# Patient Record
Sex: Female | Born: 1985 | Hispanic: Yes | Marital: Single | Smoking: Former smoker
Health system: Southern US, Community
[De-identification: ages and names within clinical notes are randomized; demographics above are authoritative.]

## PROBLEM LIST (undated history)

## (undated) DIAGNOSIS — F32A Depression, unspecified: Secondary | ICD-10-CM

## (undated) DIAGNOSIS — F329 Major depressive disorder, single episode, unspecified: Secondary | ICD-10-CM

---
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
## 2008-12-05 ENCOUNTER — Inpatient Hospital Stay (HOSPITAL_COMMUNITY): Admission: AD | Admit: 2008-12-05 | Discharge: 2008-12-05 | Payer: Self-pay | Admitting: Obstetrics

## 2008-12-17 ENCOUNTER — Inpatient Hospital Stay (HOSPITAL_COMMUNITY): Admission: RE | Admit: 2008-12-17 | Discharge: 2008-12-20 | Payer: Self-pay | Admitting: Obstetrics & Gynecology

## 2009-01-10 ENCOUNTER — Inpatient Hospital Stay (HOSPITAL_COMMUNITY): Admission: AD | Admit: 2009-01-10 | Discharge: 2009-01-10 | Payer: Self-pay | Admitting: Obstetrics

## 2009-11-13 ENCOUNTER — Ambulatory Visit: Payer: Self-pay | Admitting: Advanced Practice Midwife

## 2009-11-13 ENCOUNTER — Inpatient Hospital Stay (HOSPITAL_COMMUNITY): Admission: AD | Admit: 2009-11-13 | Discharge: 2009-11-14 | Payer: Self-pay | Admitting: Obstetrics and Gynecology

## 2009-11-30 ENCOUNTER — Emergency Department (HOSPITAL_COMMUNITY): Admission: EM | Admit: 2009-11-30 | Discharge: 2009-11-30 | Payer: Self-pay | Admitting: Emergency Medicine

## 2010-06-18 LAB — COMPREHENSIVE METABOLIC PANEL
ALT: 38 U/L — ABNORMAL HIGH (ref 0–35)
AST: 39 U/L — ABNORMAL HIGH (ref 0–37)
Albumin: 4.2 g/dL (ref 3.5–5.2)
Calcium: 8.9 mg/dL (ref 8.4–10.5)
Chloride: 112 mEq/L (ref 96–112)
Creatinine, Ser: 0.49 mg/dL (ref 0.4–1.2)
GFR calc Af Amer: >60 mL/min (ref >60)
Sodium: 142 mEq/L (ref 135–145)
Total Bilirubin: 0.2 mg/dL — ABNORMAL LOW (ref 0.3–1.2)

## 2010-06-18 LAB — CBC
HCT: 33.9 % — ABNORMAL LOW (ref 36.0–46.0)
Hemoglobin: 11.3 g/dL — ABNORMAL LOW (ref 12.0–15.0)
MCH: 29.3 pg (ref 26.0–34.0)
MCHC: 33.3 g/dL (ref 30.0–36.0)
MCV: 87.8 fL (ref 78.0–100.0)
Platelets: 351 10*3/uL (ref 150–400)
RBC: 3.86 MIL/uL — ABNORMAL LOW (ref 3.87–5.11)
RDW: 13.8 % (ref 11.5–15.5)
WBC: 8.2 10*3/uL (ref 4.0–10.5)

## 2010-06-18 LAB — DIFFERENTIAL
Eosinophils Relative: 1 % (ref 0–5)
Lymphocytes Relative: 20 % (ref 12–46)
Lymphs Abs: 1.7 10*3/uL (ref 0.7–4.0)
Monocytes Absolute: 0.3 10*3/uL (ref 0.1–1.0)

## 2010-06-19 LAB — URINALYSIS, ROUTINE W REFLEX MICROSCOPIC
Glucose, UA: NEGATIVE mg/dL
Specific Gravity, Urine: >1.03 — ABNORMAL HIGH (ref 1.005–1.030)

## 2010-06-19 LAB — CBC
Platelets: 258 10*3/uL (ref 150–400)
RDW: 13.9 % (ref 11.5–15.5)
WBC: 10 10*3/uL (ref 4.0–10.5)

## 2010-06-19 LAB — URINE MICROSCOPIC-ADD ON: WBC, UA: NONE SEEN WBC/hpf (ref <3)

## 2010-06-19 LAB — POCT PREGNANCY, URINE: Preg Test, Ur: NEGATIVE

## 2010-07-10 LAB — CBC
HCT: 26.1 % — ABNORMAL LOW (ref 36.0–46.0)
MCHC: 33.7 g/dL (ref 30.0–36.0)
MCV: 85.4 fL (ref 78.0–100.0)
RBC: 3.05 MIL/uL — ABNORMAL LOW (ref 3.87–5.11)
RBC: 3.54 MIL/uL — ABNORMAL LOW (ref 3.87–5.11)
WBC: 10 10*3/uL (ref 4.0–10.5)
WBC: 9.1 10*3/uL (ref 4.0–10.5)

## 2010-08-21 NOTE — Op Note (Signed)
   [name], [name]                     ACCOUNT NO.:  [id number]   MEDICAL RECORD NO.:  [id number]                   PATIENT TYPE:  INP   LOCATION:  9134                                 FACILITY:  [facility]   PHYSICIAN:  Phil D. Okey Dupre, M.D.                  DATE OF BIRTH:  12/24/1985   DATE OF PROCEDURE:  12/05/2002  DATE OF DISCHARGE:                                 OPERATIVE REPORT   PROCEDURE:  Low flap transverse cesarean section.   PREOPERATIVE DIAGNOSIS:  Primary cesarean section for macrosomia.   POSTOPERATIVE DIAGNOSIS:  Primary cesarean section for macrosomia.   SURGEON:  Javier Glazier. Okey Dupre, M.D.   ANESTHESIA:  Spinal.   ESTIMATED BLOOD LOSS:  500 mL.   PROCEDURE WENT AS FOLLOWS:  Under satisfactory spinal anesthesia with the  patient in dorsal supine position, a Foley catheter in the urinary bladder,  the abdomen was prepped and draped in the usual sterile manner, and entered  through a transverse Pfannenstiel incision situated 3 cm above the symphysis  pubis and extending for a total length of 18 cm.  The abdomen was entered by  layers and on entering the peritoneal cavity, the visceral peritoneum and  the anterior surface of the uterus was opened transversely and the bladder  pushed away from the lower uterine segment was entered by sharp and blunt  dissection and from ROT presentation, the baby was easily delivered.  The  cord doubly clamped, divided, handed to the pediatrician.  The cord was  hyperspiral.  Cord blood was taken for sampling.  The placenta was  spontaneously removed and the uterus explored and closed with a continuous  running-lock 0 Vicryl in an atraumatic needle.  The area observed for  bleeding, none was noted.  The pelvis was irrigated and the fascia was  closed with a continuous running lock 0 Vicryl from either end of the  incision and meeting in the midpoint.  Subcutaneous bleeders were controlled  with hot cautery.  Skin staples were used for  skin edge approximation.  A  dry sterile dressing applied.  The patient tolerated the procedure well, was  transferred to the recovery room in satisfactory condition.  Tape,  instrument, sponge, and needle count reported correct at the end of the  procedure.  The placenta was sent for pathological diagnosis.                                               Phil D. Okey Dupre, M.D.    PDR/MEDQ  D:  12/05/2002  T:  12/05/2002  Job:  [id number]

## 2010-08-21 NOTE — Discharge Summary (Signed)
[name], [name]                     ACCOUNT NO.:  [id number]   MEDICAL RECORD NO.:  [id number]                   PATIENT TYPE:  INP   LOCATION:  9134                                 [facility]ILITY:  [facility]   PHYSICIAN:  Phil D. Okey Dupre, M.D.                  DATE OF BIRTH:  1985/08/04   DATE OF ADMISSION:  12/05/2002  DATE OF DISCHARGE:  12/08/2002                                 DISCHARGE SUMMARY   PROCEDURES:  Low transverse C-section.   DISCHARGE DIAGNOSES:  1. Intrauterine pregnancy at 73 and two-sevenths weeks gestation.  2. Status post low transverse cesarean section.  3. Macrosomic baby.  4. Delivery of a viable female infant.   DISCHARGE MEDICATIONS:  1. Ibuprofen 600 mg one p.o. q.6h. p.r.n. mild pain.  2. Percocet 5/325 one to two tablets p.o. q.6h. p.r.n. stronger pain.  3. Depo-Provera - the patient received injection while hospitalized and the     next injection will be due in December 2004.  4. Prenatal vitamins one tablet p.o. daily while breastfeeding.   FOLLOW-UP:  The patient will follow up at [facility] in six weeks for a  postpartum evaluation.   HOSPITAL COURSE:  [name] is an 25 year old G1 P0 at 35 and two-  sevenths weeks gestation presenting to undergo low transverse C-section  secondary to macrosomic infant.  Ultrasound performed at 38 weeks revealed  an estimated fetal weight of 4300 grams.  The patient and family opted for a  low transverse C-section.  The patient underwent low transverse C-section  without complication.  For detailed operative report, please see operative  dictation.  Delivery of a viable female infant under epidural anesthesia was  performed without complication.  Apgars on the female infant were 8 at one  minute and 9 at five minutes.  The patient did well in the postoperative  period.  The patient is breastfeeding her infant and desired Depo-Provera  for contraception.  The patient was discharged on postoperative day #3  in  stable condition.  Staples were removed prior to discharge.   DISCHARGE LABORATORY DATA:  Blood type O positive, antibody negative.  RPR  nonreactive.  Rubella immune.  Hepatitis surface antigen negative.  HIV  nonreactive.  White blood cells 9.2, hemoglobin 10.2, platelets 175.   DISCHARGE INSTRUCTIONS:  1. Wound care per routine.  The patient was instructed to keep Steri-Strips     in place until they had fallen off.  2. The patient was to contact physician with development of fever,     significant drainage from incision site, redness around incision site.  3.     Diet at home:  Regular.  4. Activity:  No heavy lifting greater than 10 pounds for the next six     weeks.  5. Sexual activity:  Nothing per vagina for the next six weeks.     Nilda Simmer, M.D.  Phil D. Okey Dupre, M.D.    KS/MEDQ  D:  02/07/2003  T:  02/07/2003  Job:  [id number]

## 2010-10-15 ENCOUNTER — Inpatient Hospital Stay (HOSPITAL_COMMUNITY)
Admission: AD | Admit: 2010-10-15 | Discharge: 2010-10-16 | Disposition: A | Discharge status: Home / Self Care | Payer: Self-pay | Source: Ambulatory Visit | Attending: Obstetrics & Gynecology | Admitting: Obstetrics & Gynecology

## 2010-10-15 DIAGNOSIS — O2630 Retained intrauterine contraceptive device in pregnancy, unspecified trimester: Secondary | ICD-10-CM | POA: Diagnosis present

## 2010-10-15 DIAGNOSIS — Z30432 Encounter for removal of intrauterine contraceptive device: Secondary | ICD-10-CM | POA: Insufficient documentation

## 2010-10-15 DIAGNOSIS — R109 Unspecified abdominal pain: Secondary | ICD-10-CM | POA: Insufficient documentation

## 2010-10-15 DIAGNOSIS — O9989 Other specified diseases and conditions complicating pregnancy, childbirth and the puerperium: Secondary | ICD-10-CM

## 2010-10-15 DIAGNOSIS — O99891 Other specified diseases and conditions complicating pregnancy: Secondary | ICD-10-CM | POA: Insufficient documentation

## 2010-10-15 NOTE — Progress Notes (Signed)
Pt reports she had positive preg test x 2, has IUD in place, having lower abd pain worse on left. Seen at [facility] today and was told to come here. Some spotting over last 2 weeks. LMP 08/20/2010.

## 2010-10-16 ENCOUNTER — Inpatient Hospital Stay (HOSPITAL_COMMUNITY): Payer: Self-pay

## 2010-10-16 ENCOUNTER — Encounter (HOSPITAL_COMMUNITY): Payer: Self-pay | Admitting: *Deleted

## 2010-10-16 DIAGNOSIS — O4690 Antepartum hemorrhage, unspecified, unspecified trimester: Secondary | ICD-10-CM

## 2010-10-16 DIAGNOSIS — O2630 Retained intrauterine contraceptive device in pregnancy, unspecified trimester: Secondary | ICD-10-CM | POA: Diagnosis present

## 2010-10-16 LAB — WET PREP, GENITAL: Trich, Wet Prep: NONE SEEN

## 2010-10-16 LAB — URINE MICROSCOPIC-ADD ON

## 2010-10-16 LAB — GC/CHLAMYDIA PROBE AMP, GENITAL: Chlamydia, DNA Probe: NEGATIVE

## 2010-10-16 LAB — URINALYSIS, ROUTINE W REFLEX MICROSCOPIC
Bilirubin Urine: NEGATIVE
Hgb urine dipstick: NEGATIVE
Ketones, ur: NEGATIVE mg/dL
Nitrite: NEGATIVE
pH: 6 (ref 5.0–8.0)

## 2010-10-16 LAB — HCG, QUANTITATIVE, PREGNANCY: hCG, Beta Chain, Quant, S: 9402 m[IU]/mL — ABNORMAL HIGH (ref <5)

## 2010-10-16 MED ORDER — PRENATAL VITAMINS (DIS) PO TABS: 1.0000 | ORAL_TABLET | Freq: Every day | ORAL | Status: DC

## 2010-10-16 NOTE — Progress Notes (Signed)
SSE done per [place]. [place], CNM.

## 2010-10-16 NOTE — Progress Notes (Signed)
Pt presents to [facility] with c/o cramping that started about 2 weeks ago. [facility] to [facility] and was told she had a + pregnancy test.  Pt has a mirena in place.

## 2010-10-16 NOTE — ED Notes (Signed)
Chief Walkup Basset, CNM at bedside.  Assessment done and poc discussed with pt.

## 2010-10-16 NOTE — ED Notes (Signed)
Jasleen Riepe Basset, CNM at bedside.  [place] results discussed with pt.  discharge instructions discussed with pt.

## 2010-10-16 NOTE — ED Provider Notes (Signed)
History   Chief Complaint:  Abdominal Pain +UPT, Paragard IUD in place x 1 year   [name] is  25 y.o. [id number].  Patient's last menstrual period was 08/20/2010.Marland Kitchen  Her pregnancy status is positive.  She presents complaining of Abdominal Pain . Onset is described as intermittent and has been present for  1 weeks.   OB History    Grav Para Term Preterm Abortions TAB SAB Ect Mult Living   3 2 2  0 0 0 0 0 0 2       Past Medical History  Diagnosis Date  . No pertinent past medical history     Past Surgical History  Procedure Date  . Cesarean section     No family history on file.  History  Substance Use Topics  . Smoking status: Current Some Day Smoker  . Smokeless tobacco: Not on file  . Alcohol Use: Yes    Allergies: No Known Allergies  No prescriptions prior to admission    Review of Systems - History obtained from the patient Breast ROS: negative for breast lumps,  positive for - tenderness Gastrointestinal ROS: positive for - abdominal pain and nausea/vomiting Genito-Urinary ROS: no dysuria, trouble voiding, or hematuria  Physical Exam   Blood pressure 131/74, pulse 68, temperature 99.6 F (37.6 C), temperature source Oral, resp. rate 20, height 4\' 10"  (1.473 m), weight 173 lb (78.472 kg), last menstrual period 08/20/2010.  General: General appearance - alert, well appearing, and in no distress, oriented to person, place, and time and overweight Mental status - alert, oriented to person, place, and time, normal mood, behavior, speech, dress, motor activity, and thought processes Abdomen - soft, nontender, nondistended, no masses or organomegaly, obese Back exam - full range of motion, no tenderness, palpable spasm or pain on motion Focused Gynecological Exam: normal external genitalia, vulva, vagina, cervix, uterus and adnexa, VULVA: normal appearing vulva with no masses, tenderness or lesions, VAGINA: normal appearing vagina with normal color and  discharge, no lesions, CERVIX: normal appearing cervix without discharge or lesions, multiparous os, paragard strings visible ~ 1cm below ext os, UTERUS: uterus is normal size, shape, consistency and nontender, ADNEXA: normal adnexa in size, nontender and no masses, exam limited by pt habitus  Labs: Recent Results (from the past 24 hour(s))  URINALYSIS, ROUTINE W REFLEX MICROSCOPIC   Collection Time   10/16/10 12:05 AM      Component Value Range   Color, Urine YELLOW  YELLOW    Appearance CLEAR  CLEAR    Specific Gravity, Urine 1.025  1.005 - 1.030    pH 6.0  5.0 - 8.0    Glucose, UA NEGATIVE  NEGATIVE (mg/dL)   Hgb urine dipstick NEGATIVE  NEGATIVE    Bilirubin Urine NEGATIVE  NEGATIVE    Ketones NEGATIVE  NEGATIVE (mg/dL)   Protein NEGATIVE  NEGATIVE (mg/dL)   Urobilinogen, UA 0.2  0.0 - 1.0 (mg/dL)   Nitrite NEGATIVE  NEGATIVE    Leukocytes, UA TRACE (*) NEGATIVE   URINE MICROSCOPIC-ADD ON   Collection Time   10/16/10 12:05 AM      Component Value Range   Squamous Epithelial / LPF FEW (*) RARE    WBC, UA 3-6  <3 (WBC/hpf)   RBC / HPF 3-6  <3 (RBC/hpf)   Bacteria, UA FEW (*) RARE   HCG, QUANTITATIVE, PREGNANCY   Collection Time   10/16/10  1:20 AM      Component Value Range   hCG, Beta Chain, Quant, S  9402 (*) <5 (mIU/mL)  ABO/RH   Collection Time   10/16/10  1:20 AM      Component Value Range   ABO/RH(D) O POS    WET PREP, GENITAL   Collection Time   10/16/10  1:42 AM      Component Value Range   Yeast, Wet Prep NONE SEEN  NONE SEEN    Trich, Wet Prep NONE SEEN  NONE SEEN    Clue Cells, Wet Prep FEW (*) NONE SEEN    WBC, Wet Prep HPF POC FEW (*) NONE SEEN   POCT PREGNANCY, URINE   Collection Time   10/16/10  1:53 AM      Component Value Range   Preg Test, Ur POSITIVE      Ultrasound Studies:   Procedures: Paragard IUD removed at pt requests after risk/benefits reviewed. Intact IUD removed without difficulty. Pt tolerated well   Assessment: Patient Active  Problem List  Diagnoses  . Pregnancy complicated by IUD (intrauterine device)     Plan: Rx Prenatal Vitamins Bleeding precautions reviewed FU at [facility] to begin prenatal care  Juliann Olesky E. 10/16/2010, 2:59 AM

## 2010-10-25 ENCOUNTER — Encounter (HOSPITAL_COMMUNITY): Payer: Self-pay | Admitting: *Deleted

## 2010-10-25 ENCOUNTER — Inpatient Hospital Stay (HOSPITAL_COMMUNITY)
Admission: AD | Admit: 2010-10-25 | Discharge: 2010-10-25 | Disposition: A | Discharge status: Home / Self Care | Payer: Self-pay | Source: Ambulatory Visit | Attending: Obstetrics and Gynecology | Admitting: Obstetrics and Gynecology

## 2010-10-25 ENCOUNTER — Inpatient Hospital Stay (HOSPITAL_COMMUNITY): Payer: Self-pay

## 2010-10-25 DIAGNOSIS — O4690 Antepartum hemorrhage, unspecified, unspecified trimester: Secondary | ICD-10-CM | POA: Insufficient documentation

## 2010-10-25 DIAGNOSIS — R109 Unspecified abdominal pain: Secondary | ICD-10-CM | POA: Insufficient documentation

## 2010-10-25 DIAGNOSIS — O99891 Other specified diseases and conditions complicating pregnancy: Secondary | ICD-10-CM | POA: Insufficient documentation

## 2010-10-25 LAB — CBC
Hemoglobin: 11.3 g/dL — ABNORMAL LOW (ref 12.0–15.0)
MCH: 28.6 pg (ref 26.0–34.0)
MCHC: 33.8 g/dL (ref 30.0–36.0)
MCV: 84.6 fL (ref 78.0–100.0)

## 2010-10-25 MED ORDER — KETOROLAC TROMETHAMINE 60 MG/2ML IM SOLN
60.0000 mg | Freq: Once | INTRAMUSCULAR | Status: AC
  Administered 2010-10-25: 60 mg via INTRAMUSCULAR
  Filled 2010-10-25: qty 2

## 2010-10-25 NOTE — ED Provider Notes (Signed)
History     Chief Complaint  Patient presents with  . Abdominal Pain   HPI Pt seen in MAU on 10/16/10 for pelvic pain.  +IUP confirmed by [place], IUD was in place, pulled at that visit.  Here today with report of increased  Vaginal bleeding and abdominal and back pain since 1500.  Denies the passage of clots.  Patient reports pain of an 8/10.  Past Medical History  Diagnosis Date  . No pertinent past medical history     Past Surgical History  Procedure Date  . Cesarean section     No family history on file.  History  Substance Use Topics  . Smoking status: Former Games developer  . Smokeless tobacco: Not on file  . Alcohol Use: 0.6 oz/week    1 Cans of beer per week    Allergies: No Known Allergies  Prescriptions prior to admission  Medication Sig Dispense Refill  . Prenatal Vitamins (DIS) TABS Take 1 tablet by mouth daily.  90 tablet  prn    Review of Systems  Gastrointestinal: Positive for abdominal pain.  Genitourinary:       +vaginal bleeding  Musculoskeletal: Positive for back pain.  All other systems reviewed and are negative.   Physical Exam   Blood pressure 126/70, pulse 94, temperature 98.3 F (36.8 C), temperature source Oral, resp. rate 20, height 4' 9.5" (1.461 m), weight 77.293 kg (170 lb 6.4 oz), last menstrual period 08/20/2010.  Physical Exam  Constitutional: She is oriented to person, place, and time. She appears well-developed and well-nourished.       Uncomfortable appearing  HENT:  Head: Normocephalic.  Neck: Normal range of motion. Neck supple.  Cardiovascular: Normal rate, regular rhythm and normal heart sounds.  Exam reveals no gallop and no friction rub.   No murmur heard. Respiratory: Effort normal and breath sounds normal. No respiratory distress.  GI: Soft. She exhibits no mass. There is tenderness (suprapubic). There is no rebound and no guarding.  Genitourinary: There is bleeding (moderate; +clots) around the vagina.  Neurological: She is  alert and oriented to person, place, and time.  Skin: Skin is warm and dry.    MAU Course  Procedures Ultrasound - +FHR; no subchorionic hemorrhage Beta HCG CBC - 11.3 hgb IM Toradol for pain  Assessment and Plan  1.  Vaginal Bleeding in Pregnancy  Plan: -DC to home -Reviewed bleeding precautions -Begin prenatal care  [facility] 10/25/2010, 8:04 PM

## 2010-10-25 NOTE — Progress Notes (Signed)
Vaginal bleeding started today, on her first pad.  No clots.

## 2010-10-25 NOTE — Progress Notes (Signed)
Pt presents to MAU with vag bleeding x 2 hrs and lower abd and back pain.  Pt is G3 P2, early pregnant.  Seen in MAU last wk.

## 2010-12-11 LAB — RPR: RPR: NONREACTIVE

## 2010-12-11 LAB — ANTIBODY SCREEN: Antibody Screen: NEGATIVE

## 2010-12-11 LAB — RUBELLA ANTIBODY, IGM: Rubella: IMMUNE

## 2011-05-07 ENCOUNTER — Other Ambulatory Visit: Payer: Self-pay | Admitting: Obstetrics

## 2011-06-08 ENCOUNTER — Encounter (HOSPITAL_COMMUNITY)
Admission: RE | Admit: 2011-06-08 | Discharge: 2011-06-08 | Disposition: A | Discharge status: Home / Self Care | Payer: Medicaid Other | Source: Ambulatory Visit | Attending: Obstetrics | Admitting: Obstetrics

## 2011-06-08 ENCOUNTER — Encounter (HOSPITAL_COMMUNITY): Payer: Self-pay

## 2011-06-08 HISTORY — DX: Major depressive disorder, single episode, unspecified: F32.9

## 2011-06-08 HISTORY — DX: Depression, unspecified: F32.A

## 2011-06-08 LAB — CBC
HCT: 36.4 % (ref 36.0–46.0)
MCHC: 33 g/dL (ref 30.0–36.0)
Platelets: 202 10*3/uL (ref 150–400)
RDW: 15.3 % (ref 11.5–15.5)
WBC: 9.6 10*3/uL (ref 4.0–10.5)

## 2011-06-08 LAB — SURGICAL PCR SCREEN
MRSA, PCR: NEGATIVE
Staphylococcus aureus: NEGATIVE

## 2011-06-08 LAB — RPR: RPR Ser Ql: NONREACTIVE

## 2011-06-08 NOTE — Pre-Procedure Instructions (Signed)
Patient is separated from FOB and will need to arrange for a ride home when discharged from hospital.  [name] explain this to the patient and patient verbalized understanding.

## 2011-06-08 NOTE — Patient Instructions (Signed)
   Your procedure is scheduled on:  Tuesday, March 12th  Enter through the Main Entrance of [facility] at: 7:30am Pick up the phone at the desk and dial [phone] and inform us of your arrival.  Please call this number if you have any problems the morning of surgery: [phone]  Remember: Do not eat food after midnight: Monday Do not drink clear liquids after: Monday Take these medicines the morning of surgery with a SIP OF WATER: None   Do not wear jewelry, make-up, or FINGER nail polish Do not wear lotions, powders, perfumes or deodorant. Do not shave 48 hours prior to surgery. Do not bring valuables to the hospital. Contacts, dentures or bridgework may not be worn into surgery.  Leave suitcase in the car. After Surgery it may be brought to your room. For patients being admitted to the hospital, checkout time is 11:00am the day of discharge.  Ride to be arranged by patient prior to discharged  Patients discharged on the day of surgery will not be allowed to drive home.     Remember to use your hibiclens as instructed.Please shower with 1/2 bottle the evening before your surgery and the other 1/2 bottle the morning of surgery. Neck down avoiding private area.

## 2011-06-08 NOTE — Pre-Procedure Instructions (Signed)
Eda Royal, Spanish Interpreter used for the [employer].  Ok to see patient DOS.

## 2011-06-14 MED ORDER — CEFAZOLIN SODIUM-DEXTROSE 2-3 GM-% IV SOLR
2.0000 g | INTRAVENOUS | Status: AC
  Administered 2011-06-15: 2 g via INTRAVENOUS
  Filled 2011-06-14: qty 50

## 2011-06-15 ENCOUNTER — Encounter (HOSPITAL_COMMUNITY): Payer: Self-pay | Admitting: *Deleted

## 2011-06-15 ENCOUNTER — Encounter (HOSPITAL_COMMUNITY)
Admission: RE | Disposition: A | Discharge status: Home / Self Care | Payer: Self-pay | Source: Ambulatory Visit | Attending: Obstetrics

## 2011-06-15 ENCOUNTER — Encounter (HOSPITAL_COMMUNITY): Payer: Self-pay | Admitting: Anesthesiology

## 2011-06-15 ENCOUNTER — Inpatient Hospital Stay (HOSPITAL_COMMUNITY): Payer: Medicaid Other | Admitting: Anesthesiology

## 2011-06-15 ENCOUNTER — Inpatient Hospital Stay (HOSPITAL_COMMUNITY)
Admission: RE | Admit: 2011-06-15 | Discharge: 2011-06-18 | DRG: 766 | Disposition: A | Discharge status: Home / Self Care | Payer: Medicaid Other | Source: Ambulatory Visit | Attending: Obstetrics | Admitting: Obstetrics

## 2011-06-15 DIAGNOSIS — Z98891 History of uterine scar from previous surgery: Secondary | ICD-10-CM

## 2011-06-15 DIAGNOSIS — Z01818 Encounter for other preprocedural examination: Secondary | ICD-10-CM

## 2011-06-15 DIAGNOSIS — O2630 Retained intrauterine contraceptive device in pregnancy, unspecified trimester: Secondary | ICD-10-CM

## 2011-06-15 DIAGNOSIS — O34219 Maternal care for unspecified type scar from previous cesarean delivery: Principal | ICD-10-CM | POA: Diagnosis present

## 2011-06-15 DIAGNOSIS — Z01812 Encounter for preprocedural laboratory examination: Secondary | ICD-10-CM

## 2011-06-15 DIAGNOSIS — Z302 Encounter for sterilization: Secondary | ICD-10-CM

## 2011-06-15 LAB — TYPE AND SCREEN: ABO/RH(D): O POS

## 2011-06-15 SURGERY — Surgical Case
Anesthesia: Spinal | Laterality: Bilateral

## 2011-06-15 MED ORDER — PHENYLEPHRINE HCL 10 MG/ML IJ SOLN
INTRAMUSCULAR | Status: DC | PRN
  Administered 2011-06-15 (×2): 120 ug via INTRAVENOUS

## 2011-06-15 MED ORDER — OXYTOCIN 10 UNIT/ML IJ SOLN
INTRAMUSCULAR | Status: AC
  Filled 2011-06-15: qty 1

## 2011-06-15 MED ORDER — DIPHENHYDRAMINE HCL 50 MG/ML IJ SOLN: 25.0000 mg | INTRAMUSCULAR | Status: DC | PRN

## 2011-06-15 MED ORDER — CEFAZOLIN SODIUM-DEXTROSE 2-3 GM-% IV SOLR: 2.0000 g | INTRAVENOUS | Status: DC

## 2011-06-15 MED ORDER — MEPERIDINE HCL 25 MG/ML IJ SOLN: 6.2500 mg | INTRAMUSCULAR | Status: DC | PRN

## 2011-06-15 MED ORDER — KETOROLAC TROMETHAMINE 30 MG/ML IJ SOLN: 15.0000 mg | Freq: Once | INTRAMUSCULAR | Status: DC | PRN

## 2011-06-15 MED ORDER — TETANUS-DIPHTH-ACELL PERTUSSIS 5-2.5-18.5 LF-MCG/0.5 IM SUSP: 0.5000 mL | Freq: Once | INTRAMUSCULAR | Status: DC

## 2011-06-15 MED ORDER — PROMETHAZINE HCL 25 MG/ML IJ SOLN: 6.2500 mg | INTRAMUSCULAR | Status: DC | PRN

## 2011-06-15 MED ORDER — SIMETHICONE 80 MG PO CHEW
80.0000 mg | CHEWABLE_TABLET | Freq: Three times a day (TID) | ORAL | Status: DC
  Administered 2011-06-15 – 2011-06-18 (×10): 80 mg via ORAL

## 2011-06-15 MED ORDER — DIPHENHYDRAMINE HCL 50 MG/ML IJ SOLN: 12.5000 mg | INTRAMUSCULAR | Status: DC | PRN

## 2011-06-15 MED ORDER — NALBUPHINE HCL 10 MG/ML IJ SOLN
5.0000 mg | INTRAMUSCULAR | Status: DC | PRN
  Filled 2011-06-15: qty 1

## 2011-06-15 MED ORDER — PHENYLEPHRINE 40 MCG/ML (10ML) SYRINGE FOR IV PUSH (FOR BLOOD PRESSURE SUPPORT)
PREFILLED_SYRINGE | INTRAVENOUS | Status: AC
  Filled 2011-06-15: qty 5

## 2011-06-15 MED ORDER — DIPHENHYDRAMINE HCL 25 MG PO CAPS
25.0000 mg | ORAL_CAPSULE | ORAL | Status: DC | PRN
  Filled 2011-06-15: qty 1

## 2011-06-15 MED ORDER — ONDANSETRON HCL 4 MG/2ML IJ SOLN
4.0000 mg | INTRAMUSCULAR | Status: DC | PRN
  Administered 2011-06-15: 4 mg via INTRAVENOUS
  Filled 2011-06-15: qty 2

## 2011-06-15 MED ORDER — SODIUM CHLORIDE 0.9 % IV SOLN
1.0000 ug/kg/h | INTRAVENOUS | Status: DC | PRN
  Filled 2011-06-15: qty 2.5

## 2011-06-15 MED ORDER — OXYTOCIN 10 UNIT/ML IJ SOLN
INTRAMUSCULAR | Status: DC | PRN
  Administered 2011-06-15: 20 [IU] via INTRAMUSCULAR

## 2011-06-15 MED ORDER — ONDANSETRON HCL 4 MG/2ML IJ SOLN: 4.0000 mg | Freq: Three times a day (TID) | INTRAMUSCULAR | Status: DC | PRN

## 2011-06-15 MED ORDER — HYDROMORPHONE HCL PF 1 MG/ML IJ SOLN
INTRAMUSCULAR | Status: AC
  Administered 2011-06-15: 0.5 mg via INTRAVENOUS
  Filled 2011-06-15: qty 1

## 2011-06-15 MED ORDER — SCOPOLAMINE 1 MG/3DAYS TD PT72
1.0000 | MEDICATED_PATCH | Freq: Once | TRANSDERMAL | Status: DC
  Administered 2011-06-15: 1.5 mg via TRANSDERMAL

## 2011-06-15 MED ORDER — NALOXONE HCL 0.4 MG/ML IJ SOLN: 0.4000 mg | INTRAMUSCULAR | Status: DC | PRN

## 2011-06-15 MED ORDER — WITCH HAZEL-GLYCERIN EX PADS: 1.0000 "application " | MEDICATED_PAD | CUTANEOUS | Status: DC | PRN

## 2011-06-15 MED ORDER — OXYTOCIN 20 UNITS IN LACTATED RINGERS INFUSION - SIMPLE
125.0000 mL/h | INTRAVENOUS | Status: AC
  Administered 2011-06-15: 125 mL/h via INTRAVENOUS

## 2011-06-15 MED ORDER — SIMETHICONE 80 MG PO CHEW: 80.0000 mg | CHEWABLE_TABLET | ORAL | Status: DC | PRN

## 2011-06-15 MED ORDER — PRENATAL MULTIVITAMIN CH
1.0000 | ORAL_TABLET | Freq: Every day | ORAL | Status: DC
  Administered 2011-06-16 – 2011-06-18 (×3): 1 via ORAL
  Filled 2011-06-15 (×3): qty 1

## 2011-06-15 MED ORDER — SCOPOLAMINE 1 MG/3DAYS TD PT72
MEDICATED_PATCH | TRANSDERMAL | Status: AC
  Filled 2011-06-15: qty 1

## 2011-06-15 MED ORDER — LACTATED RINGERS IV SOLN
INTRAVENOUS | Status: DC
  Administered 2011-06-15: 18:00:00 via INTRAVENOUS

## 2011-06-15 MED ORDER — OXYCODONE-ACETAMINOPHEN 5-325 MG PO TABS
1.0000 | ORAL_TABLET | ORAL | Status: DC | PRN
Start: 2011-06-15 — End: 2011-06-18
  Administered 2011-06-15 – 2011-06-18 (×13): 2 via ORAL
  Filled 2011-06-15 (×8): qty 2
  Filled 2011-06-15: qty 1
  Filled 2011-06-15 (×5): qty 2

## 2011-06-15 MED ORDER — BUPIVACAINE IN DEXTROSE 0.75-8.25 % IT SOLN
INTRATHECAL | Status: DC | PRN
  Administered 2011-06-15: 1.4 mL via INTRATHECAL

## 2011-06-15 MED ORDER — KETOROLAC TROMETHAMINE 30 MG/ML IJ SOLN
30.0000 mg | Freq: Four times a day (QID) | INTRAMUSCULAR | Status: AC | PRN
  Administered 2011-06-15: 30 mg via INTRAVENOUS
  Filled 2011-06-15: qty 1

## 2011-06-15 MED ORDER — HYDROMORPHONE HCL PF 1 MG/ML IJ SOLN
0.2500 mg | INTRAMUSCULAR | Status: DC | PRN
  Administered 2011-06-15 (×2): 0.5 mg via INTRAVENOUS

## 2011-06-15 MED ORDER — LANOLIN HYDROUS EX OINT: 1.0000 "application " | TOPICAL_OINTMENT | CUTANEOUS | Status: DC | PRN

## 2011-06-15 MED ORDER — IBUPROFEN 600 MG PO TABS
600.0000 mg | ORAL_TABLET | Freq: Four times a day (QID) | ORAL | Status: DC | PRN
  Filled 2011-06-15 (×5): qty 1

## 2011-06-15 MED ORDER — MORPHINE SULFATE (PF) 0.5 MG/ML IJ SOLN
INTRAMUSCULAR | Status: DC | PRN
  Administered 2011-06-15: .2 mg via INTRATHECAL

## 2011-06-15 MED ORDER — DIPHENHYDRAMINE HCL 25 MG PO CAPS: 25.0000 mg | ORAL_CAPSULE | Freq: Four times a day (QID) | ORAL | Status: DC | PRN

## 2011-06-15 MED ORDER — ONDANSETRON HCL 4 MG/2ML IJ SOLN
INTRAMUSCULAR | Status: DC | PRN
  Administered 2011-06-15: 4 mg via INTRAVENOUS

## 2011-06-15 MED ORDER — SODIUM CHLORIDE 0.9 % IJ SOLN: 3.0000 mL | INTRAMUSCULAR | Status: DC | PRN

## 2011-06-15 MED ORDER — ZOLPIDEM TARTRATE 5 MG PO TABS: 5.0000 mg | ORAL_TABLET | Freq: Every evening | ORAL | Status: DC | PRN

## 2011-06-15 MED ORDER — LACTATED RINGERS IV SOLN
INTRAVENOUS | Status: DC
  Administered 2011-06-15 (×3): via INTRAVENOUS
  Administered 2011-06-15: 50 mL/h via INTRAVENOUS

## 2011-06-15 MED ORDER — KETOROLAC TROMETHAMINE 30 MG/ML IJ SOLN: 30.0000 mg | Freq: Four times a day (QID) | INTRAMUSCULAR | Status: AC | PRN

## 2011-06-15 MED ORDER — INFLUENZA VIRUS VACC SPLIT PF IM SUSP
0.5000 mL | INTRAMUSCULAR | Status: AC
  Filled 2011-06-15: qty 0.5

## 2011-06-15 MED ORDER — MORPHINE SULFATE 0.5 MG/ML IJ SOLN
INTRAMUSCULAR | Status: AC
  Filled 2011-06-15: qty 10

## 2011-06-15 MED ORDER — LACTATED RINGERS IV SOLN
INTRAVENOUS | Status: DC | PRN
  Administered 2011-06-15: 10:00:00 via INTRAVENOUS

## 2011-06-15 MED ORDER — KETOROLAC TROMETHAMINE 60 MG/2ML IM SOLN
INTRAMUSCULAR | Status: AC
  Administered 2011-06-15: 60 mg via INTRAMUSCULAR
  Filled 2011-06-15: qty 2

## 2011-06-15 MED ORDER — DIBUCAINE 1 % RE OINT: 1.0000 "application " | TOPICAL_OINTMENT | RECTAL | Status: DC | PRN

## 2011-06-15 MED ORDER — KETOROLAC TROMETHAMINE 60 MG/2ML IM SOLN
60.0000 mg | Freq: Once | INTRAMUSCULAR | Status: AC | PRN
  Administered 2011-06-15: 60 mg via INTRAMUSCULAR

## 2011-06-15 MED ORDER — ONDANSETRON HCL 4 MG/2ML IJ SOLN
INTRAMUSCULAR | Status: AC
  Filled 2011-06-15: qty 2

## 2011-06-15 MED ORDER — ONDANSETRON HCL 4 MG PO TABS: 4.0000 mg | ORAL_TABLET | ORAL | Status: DC | PRN

## 2011-06-15 MED ORDER — FENTANYL CITRATE 0.05 MG/ML IJ SOLN
INTRAMUSCULAR | Status: DC | PRN
  Administered 2011-06-15: 12.5 ug via INTRATHECAL

## 2011-06-15 MED ORDER — CEFAZOLIN SODIUM 1-5 GM-% IV SOLN
INTRAVENOUS | Status: AC
  Filled 2011-06-15: qty 50

## 2011-06-15 MED ORDER — SENNOSIDES-DOCUSATE SODIUM 8.6-50 MG PO TABS
2.0000 | ORAL_TABLET | Freq: Every day | ORAL | Status: DC
  Administered 2011-06-15 – 2011-06-17 (×3): 2 via ORAL

## 2011-06-15 MED ORDER — FENTANYL CITRATE 0.05 MG/ML IJ SOLN
INTRAMUSCULAR | Status: AC
  Filled 2011-06-15: qty 2

## 2011-06-15 MED ORDER — MENTHOL 3 MG MT LOZG: 1.0000 | LOZENGE | OROMUCOSAL | Status: DC | PRN

## 2011-06-15 MED ORDER — IBUPROFEN 600 MG PO TABS
600.0000 mg | ORAL_TABLET | Freq: Four times a day (QID) | ORAL | Status: DC
  Administered 2011-06-16 – 2011-06-18 (×11): 600 mg via ORAL
  Filled 2011-06-15 (×6): qty 1

## 2011-06-15 SURGICAL SUPPLY — 27 items
CLOTH BEACON ORANGE TIMEOUT ST (SAFETY) ×2 IMPLANT
CONTAINER PREFILL 10% NBF 15ML (MISCELLANEOUS) ×4 IMPLANT
DERMABOND ADVANCED (GAUZE/BANDAGES/DRESSINGS) ×1
DERMABOND ADVANCED .7 DNX12 (GAUZE/BANDAGES/DRESSINGS) ×1 IMPLANT
ELECT REM PT RETURN 9FT ADLT (ELECTROSURGICAL) ×2
ELECTRODE REM PT RTRN 9FT ADLT (ELECTROSURGICAL) ×1 IMPLANT
EXTRACTOR VACUUM M CUP 4 TUBE (SUCTIONS) IMPLANT
GLOVE BIO SURGEON STRL SZ8.5 (GLOVE) ×4 IMPLANT
GOWN PREVENTION PLUS LG XLONG (DISPOSABLE) ×4 IMPLANT
GOWN PREVENTION PLUS XXLARGE (GOWN DISPOSABLE) ×2 IMPLANT
KIT ABG SYR 3ML LUER SLIP (SYRINGE) IMPLANT
NEEDLE HYPO 25X5/8 SAFETYGLIDE (NEEDLE) IMPLANT
NS IRRIG 1000ML POUR BTL (IV SOLUTION) ×2 IMPLANT
PACK C SECTION WH (CUSTOM PROCEDURE TRAY) ×2 IMPLANT
SLEEVE SCD COMPRESS KNEE MED (MISCELLANEOUS) IMPLANT
SUT CHROMIC 0 CT 802H (SUTURE) ×2 IMPLANT
SUT CHROMIC 1 CTX 36 (SUTURE) ×4 IMPLANT
SUT CHROMIC 2 0 SH (SUTURE) ×2 IMPLANT
SUT GUT PLAIN 0 CT-3 TAN 27 (SUTURE) ×2 IMPLANT
SUT MON AB 4-0 PS1 27 (SUTURE) ×2 IMPLANT
SUT VIC AB 0 CT1 18XCR BRD8 (SUTURE) IMPLANT
SUT VIC AB 0 CT1 8-18 (SUTURE)
SUT VIC AB 0 CTX 36 (SUTURE) ×2
SUT VIC AB 0 CTX36XBRD ANBCTRL (SUTURE) ×2 IMPLANT
TOWEL OR 17X24 6PK STRL BLUE (TOWEL DISPOSABLE) ×4 IMPLANT
TRAY FOLEY CATH 14FR (SET/KITS/TRAYS/PACK) ×2 IMPLANT
WATER STERILE IRR 1000ML POUR (IV SOLUTION) ×2 IMPLANT

## 2011-06-15 NOTE — Progress Notes (Signed)
UR chart review completed.  

## 2011-06-15 NOTE — Transfer of Care (Signed)
Immediate Anesthesia Transfer of Care Note  Patient: [name]  Procedure(s) Performed: Procedure(s) (LRB): CESAREAN SECTION WITH BILATERAL TUBAL LIGATION (Bilateral)  Patient Location: PACU  Anesthesia Type: Spinal  Level of Consciousness: awake, alert  and oriented  Airway & Oxygen Therapy: Patient Spontanous Breathing  Post-op Assessment: Report given to PACU RN and Post -op Vital signs reviewed and stable  Post vital signs: Reviewed and stable  Complications: No apparent anesthesia complications

## 2011-06-15 NOTE — H&P (Signed)
  There has been no change in her for 6 history and physical examination since last visit

## 2011-06-15 NOTE — Addendum Note (Signed)
Addendum  created 06/15/11 1916 by Rosalia Hammers, CRNA   Modules edited:Notes Section

## 2011-06-15 NOTE — Anesthesia Preprocedure Evaluation (Signed)
Anesthesia Evaluation  Patient identified by MRN, date of birth, ID band Patient awake    Reviewed: Allergy & Precautions, H&P , NPO status , Patient's Chart, lab work & pertinent test results  Airway Mallampati: II TM Distance: >3 FB Neck ROM: full    Dental No notable dental hx.    Pulmonary neg pulmonary ROS,    Pulmonary exam normal       Cardiovascular negative cardio ROS      Neuro/Psych PSYCHIATRIC DISORDERS Depression negative neurological ROS     GI/Hepatic negative GI ROS, Neg liver ROS,   Endo/Other  Morbid obesity  Renal/GU negative Renal ROS  negative genitourinary   Musculoskeletal negative musculoskeletal ROS (+)   Abdominal (+) + obese,   Peds negative pediatric ROS (+)  Hematology negative hematology ROS (+)   Anesthesia Other Findings   Reproductive/Obstetrics (+) Pregnancy                           Anesthesia Physical Anesthesia Plan  ASA: III  Anesthesia Plan: Spinal   Post-op Pain Management:    Induction:   Airway Management Planned:   Additional Equipment:   Intra-op Plan:   Post-operative Plan:   Informed Consent: I have reviewed the patients History and Physical, chart, labs and discussed the procedure including the risks, benefits and alternatives for the proposed anesthesia with the patient or authorized representative who has indicated his/her understanding and acceptance.     Plan Discussed with: CRNA and Surgeon  Anesthesia Plan Comments:         Anesthesia Quick Evaluation

## 2011-06-15 NOTE — Anesthesia Procedure Notes (Signed)
Spinal  Patient location during procedure: OR Start time: 06/15/2011 8:56 AM End time: 06/15/2011 8:59 AM Staffing Anesthesiologist: Sandrea Hughs Performed by: anesthesiologist  Preanesthetic Checklist Completed: patient identified, site marked, surgical consent, pre-op evaluation, timeout performed, IV checked, risks and benefits discussed and monitors and equipment checked Spinal Block Patient position: sitting Prep: DuraPrep Patient monitoring: heart rate, cardiac monitor, continuous pulse ox and blood pressure Approach: midline Location: L3-4 Injection technique: single-shot Needle Needle type: Sprotte  Needle gauge: 24 G Needle length: 9 cm Assessment Sensory level: T6

## 2011-06-15 NOTE — Op Note (Signed)
And preop diagnosis previous cesarean section at term multiparity Postop diagnosis the same Anesthesia spinal Surgeon Dr. Francoise Ceo Assistant Dr. Coral Ceo Acedia patient placed in the operating table in the supine position abdomen prepped and draped bladder and to the Foley catheter a transverse suprapubic incision made through the old scar carried down to the rectus fascia fascia cleaned and incised the length of the incision recti muscles retracted laterally peritoneum incised longitudinally transverse incision made in the visceroperitoneum above the bladder bladder mobilized inferiorly transverse low uterine incision made fluid was clear patient delivered from the highway position of a female Apgar 9 and 9 the placenta was fundal removed manually and sent to labor and delivery uterine cavity clean and dry laps the uterine incision closed in one layer with continuous within normal on chromic hemostasis satisfactory bladder flap reattached to a chromic the right tube was grasped in midportion a Babcock clamp acedia pencil-tip is in and is a salpinx will abortion at approximately 1 inch of tube was transected as he is in a similar fashion does that hemostasis satisfactory blood loss 500 cc abdomen chosen as peritoneum continuous with 2-0 chromic fascia continuous with of 0 Dexon and the skin shows a subcuticular stitch of 4-0 Monocryl patient tolerated procedure well taken to recovery room in good condition end of dictation dictated by Dr. Gaynell Face on 06/15/2011 thank you and

## 2011-06-15 NOTE — Anesthesia Postprocedure Evaluation (Signed)
Anesthesia Post Note  Patient: [name]  Procedure(s) Performed: Procedure(s) (LRB): CESAREAN SECTION WITH BILATERAL TUBAL LIGATION (Bilateral)  Anesthesia type: Spinal  Patient location: PACU  Post pain: Pain level controlled  Post assessment: Post-op Vital signs reviewed  Last Vitals:  Filed Vitals:   06/15/11 1200  BP: 108/47  Pulse: 96  Temp: 37.1 C  Resp: 14    Post vital signs: Reviewed  Level of consciousness: awake  Complications: No apparent anesthesia complications

## 2011-06-15 NOTE — Anesthesia Postprocedure Evaluation (Signed)
  Anesthesia Post-op Note  Patient: [name]  Procedure(s) Performed: Procedure(s) (LRB): CESAREAN SECTION WITH BILATERAL TUBAL LIGATION (Bilateral)  Patient Location: PACU and Mother/Baby  Anesthesia Type: Spinal  Level of Consciousness: awake and alert   Airway and Oxygen Therapy: Patient Spontanous Breathing  Post-op Pain: none  Post-op Assessment: Post-op Vital signs reviewed  Post-op Vital Signs: Reviewed and stable  Complications: No apparent anesthesia complications

## 2011-06-15 NOTE — H&P (Signed)
[name], [name]         ACCOUNT NO.:  [id number]  MEDICAL RECORD NO.:  [id number]  LOCATION:  SDC                           FACILITY:  [facility]  PHYSICIAN:  Kathreen Cosier, M.D.DATE OF BIRTH:  Feb 24, 1986  DATE OF ADMISSION:  06/08/2011 DATE OF DISCHARGE:  06/08/2011                             HISTORY & PHYSICAL   The patient is a 26 year old, gravida 3, para 2-0-0-2 who has had 2 previous C-sections and her due date is June 13, 2011 and she is for repeat C-section and tubal ligation.  PAST MEDICAL HISTORY:  Negative.  PAST SURGICAL HISTORY:  Negative apart from the C-sections.  SOCIAL HISTORY:  Denies smoking, drinking, or alcohol.  FAMILY HISTORY:  Negative.  SYSTEM REVIEW:  Negative.  PHYSICAL EXAMINATION:  GENERAL:  Revealed well-developed female in no distress. HEENT:  Negative. BREASTS:  Negative. LUNGS:  Clear. HEART:  Regular rhythm.  No murmurs, no gallops. ABDOMEN:  Term size uterus. PELVIC:  Cervix long and closed. EXTREMITIES:  Negative.          ______________________________ Kathreen Cosier, M.D.     BAM/MEDQ  D:  06/14/2011  T:  06/15/2011  Job:  [id number]

## 2011-06-16 LAB — CBC
HCT: 30.2 % — ABNORMAL LOW (ref 36.0–46.0)
MCV: 89.1 fL (ref 78.0–100.0)
RBC: 3.39 MIL/uL — ABNORMAL LOW (ref 3.87–5.11)
WBC: 8 10*3/uL (ref 4.0–10.5)

## 2011-06-16 MED ORDER — HYDROMORPHONE HCL 2 MG PO TABS
1.0000 mg | ORAL_TABLET | ORAL | Status: DC | PRN
  Administered 2011-06-16 (×2): 1 mg via ORAL
  Filled 2011-06-16 (×2): qty 1

## 2011-06-16 NOTE — Progress Notes (Signed)
Pt. Still having pain control issues.  After talking with pt., she agreed to take a shower.  We discussed holding PO Dilaudid for after shower due to her nausea and dizziness with last dose.  I provided her with a kpad to help with her gas pain.  I discussed with her the benefits of moving about the room and progressing to walking in the halls.  She agreed to take in warm liquids through the night as well.  I observed her ambulate to the bathroom as discussed.

## 2011-06-16 NOTE — Progress Notes (Signed)
Pt. Has pain control concerns. New PRN diluadid order, but Pt. Is slightly nauseated with its administration. Pt. Refuses to walk in hall, says she is in too much pain. Pt. Willing to walk in room, currently sitting in chair. Risks of prolonged bedrest discussed with patient.

## 2011-06-16 NOTE — Progress Notes (Signed)
Patient ID: [name], female   DOB: 04-27-1985, 26 y.o.   MRN: [id number] Postop day 1 Vital signs normal Fundus firm Lochia negative No complaints

## 2011-06-17 NOTE — Plan of Care (Signed)
Problem: Discharge Progression Outcomes Goal: Remove staples per MD order Outcome: Not Applicable Date Met:  06/17/11 Dermabond

## 2011-06-17 NOTE — Progress Notes (Signed)
Patient ID: [name], female   DOB: 02/18/1986, 26 y.o.   MRN: [id number] ++postpartum day 2 Vital signs normal Incision clean and dry Fundus firm Lochia moderate No complaints

## 2011-06-18 NOTE — Discharge Instructions (Signed)
Discharge instructions   You can wash your hair  Shower  Eat what you want  Drink what you want  See me in 6 weeks  Your ankles are going to swell more in the next 2 weeks than when pregnant  No sex for 6 weeks   [name] A, MD 06/18/2011

## 2011-06-18 NOTE — Discharge Summary (Signed)
Obstetric Discharge Summary Reason for Admission: cesarean section Prenatal Procedures: none Intrapartum Procedures: cesarean: low cervical, transverse Postpartum Procedures: none Complications-Operative and Postpartum: none Hemoglobin  Date Value Range Status  06/16/2011 9.9* 12.0-15.0 (g/dL) Final     HCT  Date Value Range Status  06/16/2011 30.2* 36.0-46.0 (%) Final    Physical Exam:  General: alert Lochia: appropriate Uterine Fundus: firm Incision: healing well DVT Evaluation: No evidence of DVT seen on physical exam.  Discharge Diagnoses: Term Pregnancy-delivered  Discharge Information: Date: 06/18/2011 Activity: pelvic rest Diet: routine Medications: Percocet Condition: stable Instructions: refer to practice specific booklet Discharge to: home Follow-up Information    Follow up with Elwin Tsou A, MD in 6 weeks.   Contact information:   [address] [place] [address] [phone]          Newborn Data: Live born female  Birth Weight: 8 lb 13.6 oz (4015 g) APGAR: 9, 9  Home with mother.  Tiron Suski A 06/18/2011, 6:40 AM

## 2012-09-19 ENCOUNTER — Encounter (HOSPITAL_COMMUNITY): Payer: Self-pay | Admitting: *Deleted

## 2012-09-19 ENCOUNTER — Emergency Department (HOSPITAL_COMMUNITY)
Admission: EM | Admit: 2012-09-19 | Discharge: 2012-09-19 | Disposition: A | Discharge status: Home / Self Care | Payer: Self-pay | Source: Home / Self Care | Attending: Family Medicine | Admitting: Family Medicine

## 2012-09-19 DIAGNOSIS — K644 Residual hemorrhoidal skin tags: Secondary | ICD-10-CM

## 2012-09-19 MED ORDER — HYDROCORTISONE ACETATE 25 MG RE SUPP: 25.0000 mg | Freq: Two times a day (BID) | RECTAL | Status: DC

## 2012-09-19 NOTE — ED Provider Notes (Signed)
History     CSN: [id number]  Arrival date & time 09/19/12  1635   First MD Initiated Contact with Patient 09/19/12 1648      Chief Complaint  Patient presents with  . Rectal Pain    (Consider location/radiation/quality/duration/timing/severity/associated sxs/prior treatment) Patient is a 27 y.o. female presenting with general illness. The history is provided by the patient.  Illness Location:  Rectal Quality:  Pain and swelling Severity:  Mild Onset quality:  Sudden Progression:  Unchanged Chronicity:  New Context:  Denies constipation problem Associated symptoms: no abdominal pain and no diarrhea     Past Medical History  Diagnosis Date  . Depression     hx - no meds    Past Surgical History  Procedure Laterality Date  . Cesarean section   2004, 2010, 2013    x 3    History reviewed. No pertinent family history.  History  Substance Use Topics  . Smoking status: Former Smoker    Types: Cigars    Quit date: 06/04/2010  . Smokeless tobacco: Never Used  . Alcohol Use: 0.6 oz/week    1 Cans of beer per week     Comment: socially    OB History   Grav Para Term Preterm Abortions TAB SAB Ect Mult Living   3 3 3  0 0 0 0 0 0 2      Review of Systems  Constitutional: Negative.   Gastrointestinal: Negative.  Negative for abdominal pain and diarrhea.  Genitourinary: Negative.     Allergies  Review of patient's allergies indicates no known allergies.  Home Medications   Current Outpatient Rx  Name  Route  Sig  Dispense  Refill  . hydrocortisone (ANUSOL-HC) 25 MG suppository   Rectal   Place 1 suppository (25 mg total) rectally 2 (two) times daily.   10 suppository   0     BP 121/64  Pulse 71  Temp(Src) 98.1 F (36.7 C) (Oral)  Resp 16  SpO2 98%  LMP 09/01/2012  Breastfeeding? No  Physical Exam  Nursing note and vitals reviewed. Constitutional: She is oriented to person, place, and time. She appears well-developed and well-nourished.   Abdominal: Soft. Bowel sounds are normal. She exhibits no distension and no mass. There is no tenderness. There is no rebound and no guarding.  Genitourinary:  sm soft min tender ext hemorrhoid to left. No bleeding or thrombosis  Neurological: She is alert and oriented to person, place, and time.  Skin: Skin is warm and dry.    ED Course  Procedures (including critical care time)  Labs Reviewed - No data to display No results found.   1. External hemorrhoid       MDM          Linna Hoff, MD 09/19/12 1712

## 2012-09-19 NOTE — ED Notes (Signed)
C/o rectal pain onset Sunday.  She felt a bump there on the outside yesterday.  No drainage.  No bleeding or itching.

## 2014-02-04 ENCOUNTER — Encounter (HOSPITAL_COMMUNITY): Payer: Self-pay | Admitting: *Deleted

## 2014-02-22 ENCOUNTER — Emergency Department (HOSPITAL_COMMUNITY)
Admission: EM | Admit: 2014-02-22 | Discharge: 2014-02-23 | Disposition: A | Discharge status: Home / Self Care | Payer: Self-pay | Attending: Emergency Medicine | Admitting: Emergency Medicine

## 2014-02-22 ENCOUNTER — Encounter (HOSPITAL_COMMUNITY): Payer: Self-pay | Admitting: *Deleted

## 2014-02-22 DIAGNOSIS — Z79899 Other long term (current) drug therapy: Secondary | ICD-10-CM | POA: Insufficient documentation

## 2014-02-22 DIAGNOSIS — L509 Urticaria, unspecified: Secondary | ICD-10-CM

## 2014-02-22 DIAGNOSIS — X58XXXA Exposure to other specified factors, initial encounter: Secondary | ICD-10-CM | POA: Insufficient documentation

## 2014-02-22 DIAGNOSIS — Y998 Other external cause status: Secondary | ICD-10-CM | POA: Insufficient documentation

## 2014-02-22 DIAGNOSIS — Z7952 Long term (current) use of systemic steroids: Secondary | ICD-10-CM | POA: Insufficient documentation

## 2014-02-22 DIAGNOSIS — Y929 Unspecified place or not applicable: Secondary | ICD-10-CM | POA: Insufficient documentation

## 2014-02-22 DIAGNOSIS — L500 Allergic urticaria: Secondary | ICD-10-CM | POA: Insufficient documentation

## 2014-02-22 DIAGNOSIS — Z8659 Personal history of other mental and behavioral disorders: Secondary | ICD-10-CM | POA: Insufficient documentation

## 2014-02-22 DIAGNOSIS — Z87891 Personal history of nicotine dependence: Secondary | ICD-10-CM | POA: Insufficient documentation

## 2014-02-22 DIAGNOSIS — Y939 Activity, unspecified: Secondary | ICD-10-CM | POA: Insufficient documentation

## 2014-02-22 DIAGNOSIS — T7840XA Allergy, unspecified, initial encounter: Secondary | ICD-10-CM | POA: Insufficient documentation

## 2014-02-22 MED ORDER — HYDROCORTISONE 0.5 % EX CREA
TOPICAL_CREAM | Freq: Two times a day (BID) | CUTANEOUS | Status: DC | PRN
  Filled 2014-02-22 (×2): qty 28.35

## 2014-02-22 MED ORDER — PREDNISONE 20 MG PO TABS
60.0000 mg | ORAL_TABLET | Freq: Once | ORAL | Status: AC
  Administered 2014-02-22: 60 mg via ORAL
  Filled 2014-02-22: qty 3

## 2014-02-22 MED ORDER — DIPHENHYDRAMINE HCL 25 MG PO CAPS: 50.0000 mg | ORAL_CAPSULE | ORAL | Status: DC

## 2014-02-22 MED ORDER — FAMOTIDINE 20 MG PO TABS
20.0000 mg | ORAL_TABLET | Freq: Once | ORAL | Status: AC
  Administered 2014-02-22: 20 mg via ORAL
  Filled 2014-02-22: qty 1

## 2014-02-22 MED ORDER — DIPHENHYDRAMINE HCL 50 MG/ML IJ SOLN
50.0000 mg | Freq: Once | INTRAMUSCULAR | Status: DC
  Filled 2014-02-22: qty 1

## 2014-02-22 MED ORDER — DIPHENHYDRAMINE HCL 50 MG/ML IJ SOLN
50.0000 mg | Freq: Once | INTRAMUSCULAR | Status: AC
  Administered 2014-02-22: 50 mg via INTRAMUSCULAR

## 2014-02-22 NOTE — ED Notes (Signed)
C/o rash and itching, denies other sx, urticaria/hives noted, pt scratching, no meds PTA,  LS CTA, denies sob or difficulty swallowing or breathing, no dyspnea noted. No h/o same.

## 2014-02-22 NOTE — ED Provider Notes (Signed)
CSN: [id number]     Arrival date & time 02/22/14  2210 History   First MD Initiated Contact with Patient 02/22/14 2221     Chief Complaint  Patient presents with  . Urticaria  . Pruritis     (Consider location/radiation/quality/duration/timing/severity/associated sxs/prior Treatment) Patient is a 28 y.o. female presenting with urticaria. The history is provided by the patient and medical records. No language interpreter was used.  Urticaria Associated symptoms include a rash. Pertinent negatives include no abdominal pain, chest pain, coughing, diaphoresis, fatigue, fever, headaches, nausea or vomiting.     [name] is a 28 y.o. female  with a hx of depression presents to the Emergency Department complaining of gradual, persistent, progressively worsening urticarial rash onset 11am this morning.  Patient reports the rash has been despairing and reappearing all day.  He reports taking Benadryl at 8 PM without relief of the itching.. Associated symptoms include severe itching over her entire body.  Patient denies new perfumes, make ups, laundry detergent, foods or environments. She denies difficulty breathing, stridor, wheezing, nausea, abdominal pain, vomiting. No aggravating or alleviating factors.  Patient denies known allergies or previous episodes similar to tonight.  Past Medical History  Diagnosis Date  . Depression     hx - no meds   Past Surgical History  Procedure Laterality Date  . Cesarean section   2004, 2010, 2013    x 3   No family history on file. History  Substance Use Topics  . Smoking status: Former Smoker    Types: Cigars    Quit date: 06/04/2010  . Smokeless tobacco: Never Used  . Alcohol Use: 0.6 oz/week    1 Cans of beer per week     Comment: socially   OB History    Gravida Para Term Preterm AB TAB SAB Ectopic Multiple Living   3 3 3  0 0 0 0 0 0 2     Review of Systems  Constitutional: Negative for fever, diaphoresis, appetite change,  fatigue and unexpected weight change.  HENT: Negative for mouth sores.   Eyes: Negative for visual disturbance.  Respiratory: Negative for cough, chest tightness, shortness of breath and wheezing.   Cardiovascular: Negative for chest pain.  Gastrointestinal: Negative for nausea, vomiting, abdominal pain, diarrhea and constipation.  Endocrine: Negative for polydipsia, polyphagia and polyuria.  Genitourinary: Negative for dysuria, urgency, frequency and hematuria.  Musculoskeletal: Negative for back pain and neck stiffness.  Skin: Positive for rash.  Allergic/Immunologic: Negative for immunocompromised state.  Neurological: Negative for syncope, light-headedness and headaches.  Hematological: Does not bruise/bleed easily.  Psychiatric/Behavioral: Negative for sleep disturbance. The patient is not nervous/anxious.       Allergies  Review of patient's allergies indicates no known allergies.  Home Medications   Prior to Admission medications   Medication Sig Start Date End Date Taking? Authorizing Provider  diphenhydrAMINE (BENADRYL) 25 MG tablet Take 1 tablet (25 mg total) by mouth every 6 (six) hours as needed for itching (Rash). 02/23/14   Angenette Daily, PA-C  famotidine (PEPCID) 20 MG tablet Take 1 tablet (20 mg total) by mouth 2 (two) times daily. 02/23/14   Norma Ignasiak, PA-C  hydrocortisone (ANUSOL-HC) 25 MG suppository Place 1 suppository (25 mg total) rectally 2 (two) times daily. 09/19/12   Linna HoffJames D Kindl, MD  hydrocortisone 2.5 % lotion Apply topically 2 (two) times daily. 02/23/14   Yordin Rhoda, PA-C  predniSONE (DELTASONE) 20 MG tablet Take 2 tablets (40 mg total) by mouth daily. 02/23/14  Guerin Lashomb, PA-C   BP 125/79 mmHg  Pulse 99  Temp(Src) 98.4 F (36.9 C) (Oral)  Resp 20  SpO2 100%  LMP 02/04/2014 Physical Exam  Constitutional: She is oriented to person, place, and time. She appears well-developed and well-nourished. No distress.   HENT:  Head: Normocephalic and atraumatic.  Right Ear: Tympanic membrane, external ear and ear canal normal.  Left Ear: Tympanic membrane, external ear and ear canal normal.  Nose: Nose normal. No mucosal edema or rhinorrhea.  Mouth/Throat: Uvula is midline and oropharynx is clear and moist. No uvula swelling. No oropharyngeal exudate, posterior oropharyngeal edema, posterior oropharyngeal erythema or tonsillar abscesses.  No swelling of the uvula or oropharynx   Eyes: Conjunctivae are normal.  Neck: Normal range of motion.  Patent airway No stridor; normal phonation Handling secretions without difficulty  Cardiovascular: Normal rate, normal heart sounds and intact distal pulses.   No murmur heard. Pulmonary/Chest: Effort normal and breath sounds normal. No stridor. No respiratory distress. She has no wheezes.  No wheezes or rhonchi  Abdominal: Soft. Bowel sounds are normal. There is no tenderness.  Soft and nontender  Musculoskeletal: Normal range of motion. She exhibits no edema.  Neurological: She is alert and oriented to person, place, and time.  Skin: Skin is warm and dry. Rash noted. She is not diaphoretic.  Urticaria noted over the arms, back, chest, abdomen, legs and feet Mild excoriations - no induration or fluctuance to indicate secondary infection  Psychiatric: She has a normal mood and affect.  Nursing note and vitals reviewed.   ED Course  Procedures (including critical care time) Labs Review Labs Reviewed - No data to display  Imaging Review No results found.   EKG Interpretation None      MDM   Final diagnoses:  Allergic reaction, initial encounter  Urticaria   [name] presents with allergic reaction but no clinical evidence of anaphylaxis. Patient with patent airway, normal phonation, no stridor and handling secretions. Patient given Benadryl, famotidine, prednisone and hydrocortisone. Will monitor and reassess.  12:11 AM Patient  re-evaluated prior to dc, is hemodynamically stable, in no respiratory distress, and denies the feeling of throat closing. Pt has been advised to take OTC benadryl & return to the ED if they have a mod-severe allergic rxn (s/s including throat closing, difficulty breathing, swelling of lips face or tongue). Pt is to follow up with their PCP. Pt is agreeable with plan & verbalizes understanding. I have personally reviewed patient's vitals, nursing note and any pertinent labs or imaging.  I performed an focused physical exam; undressed when appropriate .    It has been determined that no acute conditions requiring further emergency intervention are present at this time. The patient/guardian have been advised of the diagnosis and plan. I reviewed any labs and imaging including any potential incidental findings. We have discussed signs and symptoms that warrant return to the ED and they are listed in the discharge instructions.    Vital signs are stable at discharge.   BP 125/79 mmHg  Pulse 99  Temp(Src) 98.4 F (36.9 C) (Oral)  Resp 20  SpO2 100%  LMP 02/04/2014     Dahlia ClientHannah Adon Gehlhausen, PA-C 02/23/14 0012  Benny LennertJoseph L Zammit, MD 02/23/14 [phone]

## 2014-02-23 MED ORDER — FAMOTIDINE 20 MG PO TABS: 20.0000 mg | ORAL_TABLET | Freq: Two times a day (BID) | ORAL | Status: DC

## 2014-02-23 MED ORDER — PREDNISONE 20 MG PO TABS: 40.0000 mg | ORAL_TABLET | Freq: Every day | ORAL | Status: DC

## 2014-02-23 MED ORDER — DIPHENHYDRAMINE HCL 25 MG PO TABS: 25.0000 mg | ORAL_TABLET | Freq: Four times a day (QID) | ORAL | Status: DC | PRN

## 2014-02-23 MED ORDER — HYDROCORTISONE 2.5 % EX LOTN: TOPICAL_LOTION | Freq: Two times a day (BID) | CUTANEOUS | Status: DC

## 2014-02-23 NOTE — Discharge Instructions (Signed)
1. Medications: Prednisone, Benadryl, Pepcid, hydrocortisone lotion, usual home medications 2. Treatment: rest, drink plenty of fluids, take medications as prescribed 3. Follow Up: Please followup with your primary doctor in 3 days for discussion of your diagnoses and further evaluation after today's visit; if you do not have a primary care doctor use the resource guide provided to find one; followup with dermatology as needed; Return to the ER for difficulty breathing, return of allergic reaction or other concerning symptoms    Ronchas  (Hives)  Las ronchas son reas de la piel inflamadas (hinchadas) rojas y que pican. Pueden cambiar de tamao y de ubicacin en el cuerpo. Las Armed forces operational officerronchas pueden aparecer y Geneticist, moleculardesaparecer durante algunas horas o das (ronchas agudas) o durante algunas semanas (ronchas crnicas). No pueden transmitirse de [place] persona a Theodoro Clockotra (no son contagiosas). Pueden empeorar al rascarse, hacer ejercicios y por estrs emocional.  CAUSAS   Reaccin alrgica a alimentos, aditivos o frmacos.  Infecciones, incluso el resfro comn.  Enfermedades, como la vasculitis, el lupus o la enfermedad tiroidea.  Exposicin al sol, al calor o al fro.  La prctica de ejercicios.  El estrs.  El contacto con algunas sustancias qumicas. SNTOMAS   Zonas hinchadas, rojas o blancas, sobre la piel. Las ronchas pueden cambiar de [place], forma, [place]ubicacin y Armed forces logistics/support/administrative officerpueden desaparecer repentinamente.  Picazn.  Hinchazn de las The Northwestern Mutualmanos los pies y [place] rostro. Esto puede ocurrir si las ronchas se desarrollan en capas profundas de la piel. DIAGNSTICO  El mdico puede diagnosticar el problema haciendo un examen fsico. Conley RollsLe indicar anlisis de sangre o un estudio de la piel para Production assistant, radiodeterminar la causa. En algunos casos, no puede determinarse la causa.  TRATAMIENTO  Los casos leves generalmente mejoran con medicamentos como los antihistamnicos. Los casos ms graves pueden requerir una inyeccin de epinefrina de  Associate Professoremergencia. Si se conoce la causa de la urticaria, el tratamiento incluye evitar el factor desencadenante.  INSTRUCCIONES PARA EL CUIDADO EN EL HOGAR   Evite las causas que han desencadenado las ronchas.  Tome los antihistamnicos segn las indicaciones del mdico para reducir la gravedad de las ronchas. Generalmente se recomiendan los Pathmark Storesantihistamnicos que no son sedantes o con bajo efecto sedante. No conduzca vehculos mientras toma antihistamnicos.  Tome los medicamentos para la picazn exactamente como le indic el mdico.  Use ropas sueltas.  Cumpla con todas las visitas de control, segn le indique su mdico. SOLICITE ATENCIN MDICA SI:   Siente una picazn intensa o persistente que no se calma con los medicamentos.  Le duelen las articulaciones o estn inflamadas. SOLICITE ATENCIN MDICA DE INMEDIATO SI:   Tiene fiebre.  Tiene la boca o los labios hinchados.  Tiene problemas para respirar o tragar.  Siente una opresin en la garganta o en el pecho.  Siente dolor abdominal. Estos problemas pueden ser los primeros signos de una reaccin alrgica que ponga en peligro la vida. Llame a los servicios de emergencia locales (911 en los [place]). ASEGRESE DE QUE:   Comprende estas instrucciones.  Controlar su enfermedad.  Solicitar ayuda de inmediato si no mejora o si empeora. Document Released: 03/22/2005 Document Revised: 03/27/2013 [facility] CaliforniaExitCare Patient Information 2015 [place], [place]. This information is not intended to replace advice given to you by your health care provider. Make sure you discuss any questions you have with your health care provider.

## 2015-01-10 ENCOUNTER — Other Ambulatory Visit: Payer: Self-pay | Admitting: Licensed Clinical Social Worker

## 2015-02-11 ENCOUNTER — Ambulatory Visit: Payer: Self-pay

## 2015-02-12 ENCOUNTER — Ambulatory Visit: Payer: Self-pay

## 2016-08-28 ENCOUNTER — Encounter (HOSPITAL_COMMUNITY): Payer: Self-pay

## 2016-08-28 DIAGNOSIS — R109 Unspecified abdominal pain: Secondary | ICD-10-CM | POA: Insufficient documentation

## 2016-08-28 DIAGNOSIS — Z5321 Procedure and treatment not carried out due to patient leaving prior to being seen by health care provider: Secondary | ICD-10-CM | POA: Insufficient documentation

## 2016-08-28 DIAGNOSIS — R0789 Other chest pain: Secondary | ICD-10-CM | POA: Insufficient documentation

## 2016-08-28 DIAGNOSIS — Z87891 Personal history of nicotine dependence: Secondary | ICD-10-CM | POA: Insufficient documentation

## 2016-08-28 LAB — CBC
HEMATOCRIT: 36.9 % (ref 36.0–46.0)
HEMOGLOBIN: 12.2 g/dL (ref 12.0–15.0)
MCH: 28.4 pg (ref 26.0–34.0)
MCHC: 33.1 g/dL (ref 30.0–36.0)
MCV: 86 fL (ref 78.0–100.0)
Platelets: 360 10*3/uL (ref 150–400)
RBC: 4.29 MIL/uL (ref 3.87–5.11)
RDW: 14.5 % (ref 11.5–15.5)
WBC: 17.5 10*3/uL — ABNORMAL HIGH (ref 4.0–10.5)

## 2016-08-28 NOTE — ED Triage Notes (Signed)
Pt complaining of abdominal pain that radiates to chest. Pt states chest pressure, denies chest pain. Pt states SOB d/t pain. Pt denies any N/V/D. Pt denies any urinary symptoms. Pt denies any vaginal bleeding/discharge.

## 2016-08-29 ENCOUNTER — Emergency Department (HOSPITAL_COMMUNITY)
Admission: EM | Admit: 2016-08-29 | Discharge: 2016-08-29 | Disposition: A | Discharge status: Home / Self Care | Payer: Self-pay | Attending: Emergency Medicine | Admitting: Emergency Medicine

## 2016-08-29 LAB — URINALYSIS, ROUTINE W REFLEX MICROSCOPIC
Bilirubin Urine: NEGATIVE
Glucose, UA: NEGATIVE mg/dL
Hgb urine dipstick: NEGATIVE
Ketones, ur: NEGATIVE mg/dL
Leukocytes, UA: NEGATIVE
Nitrite: NEGATIVE
PH: 9 — AB (ref 5.0–8.0)
Protein, ur: NEGATIVE mg/dL
SPECIFIC GRAVITY, URINE: 1.013 (ref 1.005–1.030)

## 2016-08-29 LAB — COMPREHENSIVE METABOLIC PANEL
ALT: 17 U/L (ref 14–54)
AST: 24 U/L (ref 15–41)
Albumin: 4.2 g/dL (ref 3.5–5.0)
Alkaline Phosphatase: 89 U/L (ref 38–126)
Anion gap: 11 (ref 5–15)
BUN: 6 mg/dL (ref 6–20)
CHLORIDE: 100 mmol/L — AB (ref 101–111)
CO2: 23 mmol/L (ref 22–32)
Calcium: 9.7 mg/dL (ref 8.9–10.3)
Creatinine, Ser: 0.61 mg/dL (ref 0.44–1.00)
GFR calc Af Amer: >60 mL/min (ref >60)
Glucose, Bld: 107 mg/dL — ABNORMAL HIGH (ref 65–99)
POTASSIUM: 3.4 mmol/L — AB (ref 3.5–5.1)
SODIUM: 134 mmol/L — AB (ref 135–145)
Total Bilirubin: 1.1 mg/dL (ref 0.3–1.2)
Total Protein: 7.4 g/dL (ref 6.5–8.1)

## 2016-08-29 LAB — POC URINE PREG, ED: PREG TEST UR: NEGATIVE

## 2016-08-29 LAB — LIPASE, BLOOD: LIPASE: 19 U/L (ref 11–51)

## 2016-08-29 NOTE — ED Notes (Signed)
Pt stated that she was leaving because she was didn't want to wait any longer.

## 2016-09-02 ENCOUNTER — Inpatient Hospital Stay (HOSPITAL_COMMUNITY)
Admission: EM | Admit: 2016-09-02 | Discharge: 2016-09-06 | DRG: 419 | Disposition: A | Discharge status: Home / Self Care | Payer: Self-pay | Attending: General Surgery | Admitting: General Surgery

## 2016-09-02 ENCOUNTER — Encounter (HOSPITAL_COMMUNITY): Payer: Self-pay | Admitting: Nurse Practitioner

## 2016-09-02 ENCOUNTER — Ambulatory Visit (HOSPITAL_COMMUNITY)
Admission: EM | Admit: 2016-09-02 | Discharge: 2016-09-02 | Disposition: A | Discharge status: Home / Self Care | Payer: Self-pay | Attending: Internal Medicine | Admitting: Internal Medicine

## 2016-09-02 ENCOUNTER — Encounter (HOSPITAL_COMMUNITY): Payer: Self-pay | Admitting: *Deleted

## 2016-09-02 ENCOUNTER — Emergency Department (HOSPITAL_COMMUNITY): Payer: Self-pay

## 2016-09-02 DIAGNOSIS — K819 Cholecystitis, unspecified: Secondary | ICD-10-CM

## 2016-09-02 DIAGNOSIS — Z87891 Personal history of nicotine dependence: Secondary | ICD-10-CM

## 2016-09-02 DIAGNOSIS — D72829 Elevated white blood cell count, unspecified: Secondary | ICD-10-CM

## 2016-09-02 DIAGNOSIS — R1011 Right upper quadrant pain: Secondary | ICD-10-CM

## 2016-09-02 DIAGNOSIS — R110 Nausea: Secondary | ICD-10-CM

## 2016-09-02 DIAGNOSIS — K2970 Gastritis, unspecified, without bleeding: Secondary | ICD-10-CM

## 2016-09-02 DIAGNOSIS — E669 Obesity, unspecified: Secondary | ICD-10-CM | POA: Diagnosis present

## 2016-09-02 DIAGNOSIS — D649 Anemia, unspecified: Secondary | ICD-10-CM | POA: Diagnosis present

## 2016-09-02 DIAGNOSIS — K8000 Calculus of gallbladder with acute cholecystitis without obstruction: Principal | ICD-10-CM | POA: Diagnosis present

## 2016-09-02 DIAGNOSIS — Z6835 Body mass index (BMI) 35.0-35.9, adult: Secondary | ICD-10-CM

## 2016-09-02 LAB — URINALYSIS, ROUTINE W REFLEX MICROSCOPIC
Bilirubin Urine: NEGATIVE
Glucose, UA: NEGATIVE mg/dL
Hgb urine dipstick: NEGATIVE
Ketones, ur: NEGATIVE mg/dL
Leukocytes, UA: NEGATIVE
Nitrite: NEGATIVE
Protein, ur: NEGATIVE mg/dL
Specific Gravity, Urine: 1.016 (ref 1.005–1.030)
pH: 6 (ref 5.0–8.0)

## 2016-09-02 LAB — COMPREHENSIVE METABOLIC PANEL
ALT: 29 U/L (ref 14–54)
AST: 18 U/L (ref 15–41)
Albumin: 3.8 g/dL (ref 3.5–5.0)
Alkaline Phosphatase: 121 U/L (ref 38–126)
Anion gap: 8 (ref 5–15)
BUN: 6 mg/dL (ref 6–20)
CO2: 26 mmol/L (ref 22–32)
Calcium: 9.1 mg/dL (ref 8.9–10.3)
Chloride: 101 mmol/L (ref 101–111)
Creatinine, Ser: 0.57 mg/dL (ref 0.44–1.00)
GFR calc Af Amer: >60 mL/min (ref >60)
GFR calc non Af Amer: >60 mL/min (ref >60)
Glucose, Bld: 101 mg/dL — ABNORMAL HIGH (ref 65–99)
Potassium: 4.2 mmol/L (ref 3.5–5.1)
Sodium: 135 mmol/L (ref 135–145)
Total Bilirubin: 0.2 mg/dL — ABNORMAL LOW (ref 0.3–1.2)
Total Protein: 7.5 g/dL (ref 6.5–8.1)

## 2016-09-02 LAB — DIFFERENTIAL
Basophils Absolute: 0 10*3/uL (ref 0.0–0.1)
Basophils Relative: 0 %
EOS PCT: 2 %
Eosinophils Absolute: 0.2 10*3/uL (ref 0.0–0.7)
LYMPHS ABS: 2.3 10*3/uL (ref 0.7–4.0)
LYMPHS PCT: 16 %
MONOS PCT: 7 %
Monocytes Absolute: 1 10*3/uL (ref 0.1–1.0)
Neutro Abs: 11.2 10*3/uL — ABNORMAL HIGH (ref 1.7–7.7)
Neutrophils Relative %: 75 %

## 2016-09-02 LAB — CBC
HCT: 35.6 % — ABNORMAL LOW (ref 36.0–46.0)
Hemoglobin: 11.4 g/dL — ABNORMAL LOW (ref 12.0–15.0)
MCH: 28.2 pg (ref 26.0–34.0)
MCHC: 32 g/dL (ref 30.0–36.0)
MCV: 88.1 fL (ref 78.0–100.0)
Platelets: 480 10*3/uL — ABNORMAL HIGH (ref 150–400)
RBC: 4.04 MIL/uL (ref 3.87–5.11)
RDW: 14.4 % (ref 11.5–15.5)
WBC: 14.6 10*3/uL — ABNORMAL HIGH (ref 4.0–10.5)

## 2016-09-02 LAB — LIPASE, BLOOD: Lipase: 37 U/L (ref 11–51)

## 2016-09-02 MED ORDER — HYDROMORPHONE HCL 1 MG/ML IJ SOLN
1.0000 mg | Freq: Once | INTRAMUSCULAR | Status: AC
  Administered 2016-09-02: 1 mg via INTRAVENOUS
  Filled 2016-09-02: qty 1

## 2016-09-02 MED ORDER — PROMETHAZINE HCL 25 MG/ML IJ SOLN: 25.0000 mg | Freq: Once | INTRAMUSCULAR | Status: DC

## 2016-09-02 MED ORDER — SODIUM CHLORIDE 0.9 % IV BOLUS (SEPSIS)
1000.0000 mL | Freq: Once | INTRAVENOUS | Status: AC
  Administered 2016-09-02: 1000 mL via INTRAVENOUS

## 2016-09-02 MED ORDER — MORPHINE SULFATE (PF) 4 MG/ML IV SOLN
4.0000 mg | Freq: Once | INTRAVENOUS | Status: AC
  Administered 2016-09-02: 4 mg via INTRAVENOUS
  Filled 2016-09-02: qty 1

## 2016-09-02 MED ORDER — FAMOTIDINE IN NACL 20-0.9 MG/50ML-% IV SOLN
20.0000 mg | Freq: Once | INTRAVENOUS | Status: AC
  Administered 2016-09-02: 20 mg via INTRAVENOUS
  Filled 2016-09-02: qty 50

## 2016-09-02 MED ORDER — GI COCKTAIL ~~LOC~~
30.0000 mL | Freq: Once | ORAL | Status: AC
  Administered 2016-09-02: 30 mL via ORAL
  Filled 2016-09-02: qty 30

## 2016-09-02 MED ORDER — ONDANSETRON HCL 4 MG/2ML IJ SOLN
4.0000 mg | Freq: Once | INTRAMUSCULAR | Status: AC
  Administered 2016-09-02: 4 mg via INTRAVENOUS
  Filled 2016-09-02: qty 2

## 2016-09-02 NOTE — ED Provider Notes (Signed)
CSN: [id number]     Arrival date & time 09/02/16  1603 History   None    Chief Complaint  Patient presents with  . Abdominal Pain   (Consider location/radiation/quality/duration/timing/severity/associated sxs/prior Treatment) 31 year old female presents to clinic with a one-week history of upper right quadrant abdominal pain, is been worsening, states is worse with food, and worse with movement. She has nausea, no vomiting, no diarrhea. She denies any recent travel outside of the [place], denies history of hepatitis, states she still has her gallbladder, has had no prior symptoms similar to this. Denies possibility of pregnancy. She also denies fever as well. She went to the emergency room on 08/29/2016, had lab work drawn, but left prior to being seen. Labs reviewed, white blood cell count elevated at 17,000, however no differential was done.   The history is provided by the patient.  Abdominal Pain  Pain location:  RUQ Pain quality: aching, cramping, sharp, shooting and stabbing   Pain radiates to:  Back Pain severity:  Severe Onset quality:  Gradual Duration:  1 week Timing:  Constant Progression:  Worsening Chronicity:  New Context: eating   Context: not previous surgeries, not recent travel, not suspicious food intake and not trauma   Relieved by:  Nothing Worsened by:  Eating and movement Ineffective treatments:  None tried Associated symptoms: anorexia and nausea   Associated symptoms: no chills, no cough, no diarrhea, no dysuria, no fever, no vaginal bleeding and no vomiting     Past Medical History:  Diagnosis Date  . Depression    hx - no meds   Past Surgical History:  Procedure Laterality Date  . CESAREAN SECTION   2004, 2010, 2013   x 3   History reviewed. No pertinent family history. Social History  Substance Use Topics  . Smoking status: Former Smoker    Types: Cigars    Quit date: 06/04/2010  . Smokeless tobacco: Never Used  . Alcohol use 0.6 oz/week     1 Cans of beer per week     Comment: socially   OB History    Gravida Para Term Preterm AB Living   3 3 3  0 0 2   SAB TAB Ectopic Multiple Live Births   0 0 0 0 2     Review of Systems  Constitutional: Negative for chills and fever.  HENT: Negative.   Respiratory: Negative for cough.   Gastrointestinal: Positive for abdominal pain, anorexia and nausea. Negative for diarrhea and vomiting.  Genitourinary: Negative for dysuria, frequency and vaginal bleeding.  Musculoskeletal: Negative.   Skin: Negative.   Neurological: Negative.     Allergies  Patient has no known allergies.  Home Medications   Prior to Admission medications   Not on File   Meds Ordered and Administered this Visit  Medications - No data to display  BP 118/72 (BP Location: Right Arm)   Pulse (!) 112   Temp 99.8 F (37.7 C) (Oral)   Resp 20   LMP 08/13/2016   SpO2 99%  No data found.   Physical Exam  Constitutional: She is oriented to person, place, and time. She appears well-developed and well-nourished. No distress.  HENT:  Head: Normocephalic and atraumatic.  Right Ear: External ear normal.  Left Ear: External ear normal.  Eyes: Conjunctivae are normal.  Cardiovascular: Normal rate and regular rhythm.   Pulmonary/Chest: Effort normal and breath sounds normal.  Abdominal: Soft. Normal appearance and bowel sounds are normal. There is hepatomegaly. There is  tenderness in the right upper quadrant. There is positive Murphy's sign.  Neurological: She is alert and oriented to person, place, and time.  Skin: Skin is warm and dry. Capillary refill takes less than 2 seconds. No rash noted. She is not diaphoretic. No erythema.  Psychiatric: She has a normal mood and affect. Her behavior is normal.  Nursing note and vitals reviewed.   Urgent Care Course     Procedures (including critical care time)  Labs Review Labs Reviewed - No data to display  Imaging Review No results found.     MDM    1. Right upper quadrant abdominal pain    Subjectively, liver feels enlarged, Differential includes colicystitis, hepatitis, diverticulitis, liver disease, IBS, atypical appendicitis. Recommend going to the ER for further evaluation and management of this condition.      Dorena BodoKennard, Levert Heslop, NP 09/02/16 1700

## 2016-09-02 NOTE — Discharge Instructions (Signed)
There are number of possible diagnoses for your condition, I'm unable to rule these out here in the urgent care, I'm recommending you go to the ER for further evaluation.

## 2016-09-02 NOTE — ED Notes (Signed)
[employer]  Utilized   

## 2016-09-02 NOTE — ED Provider Notes (Signed)
Patient signed out to me by Street, PA-C.  Pending US.  US consistent with acute cholecystitis.  I appreciate Dr. Donell BeersByerly for seeing the patient.  Afebrile.  VSS.  Last oral intake about 1500 today.  Leukocytosis to 14.6.  Normal LFTs and lipase.  Prior abdominal surgeries include C-section.   Roxy HorsemanBrowning, Belinda Melroy, PA-C 09/02/16 2215    Belinda RazorKohut, Stephen, MD 09/05/16 2009

## 2016-09-02 NOTE — ED Triage Notes (Signed)
Pt presents with c/o abdominal pain. The pain began earlier this week, and has been constant for the past 2-3 days. The pain is worse after she eats food. She rep rots nausea. She denies fevers, chills, vomiting, dysuria, hematuria, constipation, diarrhea. She was here earlier this week for the pain but left due to wait time. She has been taking tums, advil, acid reducer with no improvement in the pain.

## 2016-09-02 NOTE — H&P (Signed)
[name] is an 30 y.o. female.   Level 5 caveat applies due to language barrier.  Interpreter used.    Chief Complaint: Abdominal pain HPI:  Pt comes to the ED with 5 days of waxing and waning upper abdominal pain.  The pain never went all the way away.  It has worsened over the last 1-2 days and now is in the right upper abdomen.  She feels bloated and has thrown up multiple times in the last 24 hours.  She also states that every time she has tried to eat in the last few days she has had severe pain.  She tried tums and tylenol without relief.  She denies fever/chills/diarrhea/constipation.  She is unaware of any relatives with gallbladder disease.  She has not been diagnosed with gallbladder problems before and has never experienced pain like this before.  She also has not had jaundice or abnormal colored urine/stool.    Past Medical History:  Diagnosis Date  . Depression    hx - no meds    Past Surgical History:  Procedure Laterality Date  . CESAREAN SECTION   2004, 2010, 2013   x 3    History reviewed. No pertinent family history. Social History:  reports that she quit smoking about 6 years ago. Her smoking use included Cigars. She has never used smokeless tobacco. She reports that she drinks about 0.6 oz of alcohol per week . She reports that she does not use drugs.  Allergies: No Known Allergies  Home meds:  None  Results for orders placed or performed during the hospital encounter of 09/02/16 (from the past 48 hour(s))  Urinalysis, Routine w reflex microscopic     Status: Abnormal   Collection Time: 09/02/16  5:41 PM  Result Value Ref Range   Color, Urine YELLOW YELLOW   APPearance HAZY (A) CLEAR   Specific Gravity, Urine 1.016 1.005 - 1.030   pH 6.0 5.0 - 8.0   Glucose, UA NEGATIVE NEGATIVE mg/dL   Hgb urine dipstick NEGATIVE NEGATIVE   Bilirubin Urine NEGATIVE NEGATIVE   Ketones, ur NEGATIVE NEGATIVE mg/dL   Protein, ur NEGATIVE NEGATIVE mg/dL   Nitrite  NEGATIVE NEGATIVE   Leukocytes, UA NEGATIVE NEGATIVE  Comprehensive metabolic panel     Status: Abnormal   Collection Time: 09/02/16  5:45 PM  Result Value Ref Range   Sodium 135 135 - 145 mmol/L   Potassium 4.2 3.5 - 5.1 mmol/L   Chloride 101 101 - 111 mmol/L   CO2 26 22 - 32 mmol/L   Glucose, Bld 101 (H) 65 - 99 mg/dL   BUN 6 6 - 20 mg/dL   Creatinine, Ser 0.57 0.44 - 1.00 mg/dL   Calcium 9.1 8.9 - 10.3 mg/dL   Total Protein 7.5 6.5 - 8.1 g/dL   Albumin 3.8 3.5 - 5.0 g/dL   AST 18 15 - 41 U/L   ALT 29 14 - 54 U/L   Alkaline Phosphatase 121 38 - 126 U/L   Total Bilirubin 0.2 (L) 0.3 - 1.2 mg/dL   GFR calc non Af Amer >60 >60 mL/min   GFR calc Af Amer >60 >60 mL/min    Comment: (NOTE) The eGFR has been calculated using the CKD EPI equation. This calculation has not been validated in all clinical situations. eGFR's persistently <60 mL/min signify possible Chronic Kidney Disease.    Anion gap 8 5 - 15  CBC     Status: Abnormal   Collection Time: 09/02/16  5:45 PM    Result Value Ref Range   WBC 14.6 (H) 4.0 - 10.5 K/uL   RBC 4.04 3.87 - 5.11 MIL/uL   Hemoglobin 11.4 (L) 12.0 - 15.0 g/dL   HCT 35.6 (L) 36.0 - 46.0 %   MCV 88.1 78.0 - 100.0 fL   MCH 28.2 26.0 - 34.0 pg   MCHC 32.0 30.0 - 36.0 g/dL   RDW 14.4 11.5 - 15.5 %   Platelets 480 (H) 150 - 400 K/uL  Differential     Status: Abnormal   Collection Time: 09/02/16  6:42 PM  Result Value Ref Range   Neutrophils Relative % 75 %   Neutro Abs 11.2 (H) 1.7 - 7.7 K/uL   Lymphocytes Relative 16 %   Lymphs Abs 2.3 0.7 - 4.0 K/uL   Monocytes Relative 7 %   Monocytes Absolute 1.0 0.1 - 1.0 K/uL   Eosinophils Relative 2 %   Eosinophils Absolute 0.2 0.0 - 0.7 K/uL   Basophils Relative 0 %   Basophils Absolute 0.0 0.0 - 0.1 K/uL  Lipase, blood     Status: None   Collection Time: 09/02/16  7:20 PM  Result Value Ref Range   Lipase 37 11 - 51 U/L   Us Abdomen Complete  Result Date: 09/02/2016 CLINICAL DATA:  30-year-old  female with right upper quadrant abdominal pain for 6 days. EXAM: ABDOMEN ULTRASOUND COMPLETE COMPARISON:  CT Abdomen and Pelvis 11/30/2009. FINDINGS: Gallbladder: Shadowing 2.9 cm echogenic gallstone within the neck of the gallbladder. Superimposed layering sludge. Abnormal gallbladder wall edema and thickening estimated at at least at 5 mm. Positive sonographic Murphy sign. Common bile duct: Diameter: 5 mm common normal. Liver: Mildly echogenic liver (image 37). No discrete liver lesion or intrahepatic biliary ductal dilatation. IVC: No abnormality visualized. Pancreas: Visualized portion unremarkable. Spleen: Size and appearance within normal limits. Right Kidney: Length: 12.5 cm. Echogenicity within normal limits. No mass or hydronephrosis visualized. Left Kidney: Length: 12.9 cm. Echogenicity within normal limits. No mass or hydronephrosis visualized. Abdominal aorta: Incompletely visualized due to overlying bowel gas, visualized portions within normal limits. Other findings: None. IMPRESSION: 1. Positive for Acute Cholecystitis. 2.9 cm gallstone lodged in the neck of the gallbladder with edematous gallbladder wall. 2. No evidence of CBD obstruction, and otherwise negative ultrasound appearance of the abdomen. Electronically Signed   By: H  Hall M.D.   On: 09/02/2016 21:52    Review of Systems  Constitutional: Positive for fever (no subjective fever, but low grade temp here.).  HENT: Negative.   Eyes: Negative.   Respiratory: Negative.   Cardiovascular: Negative.   Gastrointestinal: Positive for abdominal pain, nausea and vomiting. Negative for constipation and diarrhea.  Genitourinary: Negative.   Musculoskeletal: Negative.   Skin: Negative.   Neurological: Negative.   Endo/Heme/Allergies: Negative.   Psychiatric/Behavioral: Positive for depression (history of, no meds).  All other systems reviewed and are negative.   Blood pressure 126/81, pulse 100, temperature 98.9 F (37.2 C),  temperature source Oral, resp. rate 18, last menstrual period 08/13/2016, SpO2 100 %. Physical Exam  Constitutional: She is oriented to person, place, and time. She appears well-developed and well-nourished. She appears distressed.  HENT:  Head: Normocephalic and atraumatic.  Right Ear: External ear normal.  Left Ear: External ear normal.  Mouth/Throat: Oropharynx is clear and moist.  Eyes: Conjunctivae and EOM are normal. Pupils are equal, round, and reactive to light. Right eye exhibits no discharge. Left eye exhibits no discharge. No scleral icterus.  Neck: Normal range of motion. Neck   supple. No tracheal deviation present. No thyromegaly present.  Cardiovascular: Normal rate, regular rhythm and intact distal pulses.   Respiratory: Effort normal. No respiratory distress. She exhibits no tenderness.  GI: Soft. She exhibits distension. She exhibits no mass. There is tenderness (RUQ). There is guarding (voluntary guarding RUQ). There is no rebound.  Musculoskeletal: Normal range of motion. She exhibits no edema, tenderness or deformity.  Lymphadenopathy:    She has no cervical adenopathy.  Neurological: She is alert and oriented to person, place, and time. She exhibits normal muscle tone. Coordination normal.  Skin: Skin is warm and dry. No rash noted. She is not diaphoretic. No erythema. No pallor.  Psychiatric: She has a normal mood and affect. Her behavior is normal. Judgment and thought content normal.     Assessment/Plan Acute calculous cholecystitis  IV fluids NPO after 1 am IV antibiotics OR for lap chole with probable cholangiogram with Dr. [name]. Suspect patient had biliary colic with stone lodged in the neck of gallbladder and now she has progressed to acute cholecystitis.  Discussed surgery, risks, recovery with patient and friend via interpreter.    The surgical procedure was described to the patient in detail.   I discussed the incision type and location, the location of  the gallbladder, the anatomy of the bile ducts and arteries, and the typical progression of surgery.  I discussed the possibility of converting to an open operation.  I advised of the risks of bleeding, infection, damage to other structures (such as the bile duct, intestine or liver), bile leak, need for other procedures or surgeries, and post op diarrhea/constipation.  We discussed the risk of blood clot.  We discussed the recovery period and post operative restrictions.      BYERLY,FAERA, MD 09/02/2016, 11:12 PM   

## 2016-09-02 NOTE — ED Triage Notes (Signed)
Pt  Reports  r  Upper  Quadrant    Pain  Nausea   After    Eating        Symptoms      X   1  Week      No  Vomiting    No  Diarrhea

## 2016-09-02 NOTE — ED Provider Notes (Signed)
[facility]-EMERGENCY DEPT Provider Note   CSN: [id number] Arrival date & time: 09/02/16  1704     History   Chief Complaint Chief Complaint  Patient presents with  . Abdominal Pain    HPI [name] is a 31 y.o. female with a PMHx of depression, who presents to the ED with complaints of ongoing worsening RUQ pain and nausea that started 6 days ago (on Fri 08/27/16). Chart review reveals she came to the ED for eval of this on 08/29/16, had labs drawn but ended up leaving before she was seen or got her results; results of labs that day showed lipase WNL, CBC with WBC 17.5 (no differential done), CMP essentially unremarkable with normal LFTs, U/A negative, Upreg neg, EKG with sinus tachycardia but otherwise unremarkable. She went to the urgent care center today for evaluation of her ongoing RUQ pain, and was sent here due to concerns for ?hepatomegaly and +murphy's sign on exam.   She states that initially her pain was in her epigastric area but now it's mostly in the right upper quadrant, describes it as 8/10 intermittent aching nonradiating RUQ pain that worsens with eating or drinking and has been mildly improved with Advil and an over-the-counter antacid that she cannot recall the name of (later remembered that it was called Gaviscon). She reports associated nausea, particularly with eating. She states that she occasionally drinks alcohol, last consumption was 1 beer on 08/29/16, and she estimates that she drinks about 2 times per month, but doesn't drink heavily (usually just 1-2 drinks). She takes NSAIDs on a somewhat frequent basis, although recently she's decreased how often she takes them. She has had 3 C-sections but no other prior abdominal surgeries. LMP 08/08/16. She last ate at 3:30pm, but had a few sips of coca-cola about an hour before evaluation (~6pm). She does not have a PCP.   She denies fevers, chills, CP, SOB, vomiting, diarrhea/constipation, obstipation, melena, hematochezia,  hematuria, dysuria, vaginal bleeding/discharge, myalgias, arthralgias, numbness, tingling, focal weakness, or any other complaints at this time. Denies recent travel, sick contacts, or suspicious food intake.   The history is provided by the patient and medical records. A language interpreter was used (provider).  Abdominal Pain   This is a new problem. The current episode started more than 2 days ago. The problem occurs daily. The problem has been gradually worsening. The pain is associated with an unknown factor. The pain is located in the RUQ and epigastric region. The quality of the pain is aching. The pain is at a severity of 8/10. The pain is moderate. Associated symptoms include nausea. Pertinent negatives include fever, diarrhea, flatus, hematochezia, melena, vomiting, constipation, dysuria, hematuria, arthralgias and myalgias. The symptoms are aggravated by eating. The symptoms are relieved by OTC medications, NSAIDs and antacids.    Past Medical History:  Diagnosis Date  . Depression    hx - no meds    Patient Active Problem List   Diagnosis Date Noted  . Pregnancy complicated by IUD (intrauterine device) 10/16/2010    Past Surgical History:  Procedure Laterality Date  . CESAREAN SECTION   2004, 2010, 2013   x 3    OB History    Gravida Para Term Preterm AB Living   3 3 3  0 0 2   SAB TAB Ectopic Multiple Live Births   0 0 0 0 2       Home Medications    Prior to Admission medications   Not on File  Family History History reviewed. No pertinent family history.  Social History Social History  Substance Use Topics  . Smoking status: Former Smoker    Types: Cigars    Quit date: 06/04/2010  . Smokeless tobacco: Never Used  . Alcohol use 0.6 oz/week    1 Cans of beer per week     Comment: socially     Allergies   Patient has no known allergies.   Review of Systems Review of Systems  Constitutional: Negative for chills and fever.  Respiratory:  Negative for shortness of breath.   Cardiovascular: Negative for chest pain.  Gastrointestinal: Positive for abdominal pain and nausea. Negative for blood in stool, constipation, diarrhea, flatus, hematochezia, melena and vomiting.  Genitourinary: Negative for dysuria, hematuria, vaginal bleeding and vaginal discharge.  Musculoskeletal: Negative for arthralgias and myalgias.  Skin: Negative for color change.  Allergic/Immunologic: Negative for immunocompromised state.  Neurological: Negative for weakness and numbness.  Psychiatric/Behavioral: Negative for confusion.   All other systems reviewed and are negative for acute change except as noted in the HPI.    Physical Exam Updated Vital Signs BP 133/80   Pulse 92   Temp 98.9 F (37.2 C) (Oral)   Resp 18   LMP 08/13/2016   SpO2 100%   Physical Exam  Constitutional: She is oriented to person, place, and time. Vital signs are normal. She appears well-developed and well-nourished.  Non-toxic appearance. No distress.  Afebrile, nontoxic, NAD  HENT:  Head: Normocephalic and atraumatic.  Mouth/Throat: Oropharynx is clear and moist and mucous membranes are normal.  Eyes: Conjunctivae and EOM are normal. Right eye exhibits no discharge. Left eye exhibits no discharge.  Neck: Normal range of motion. Neck supple.  Cardiovascular: Normal rate, regular rhythm, normal heart sounds and intact distal pulses.  Exam reveals no gallop and no friction rub.   No murmur heard. Pulmonary/Chest: Effort normal and breath sounds normal. No respiratory distress. She has no decreased breath sounds. She has no wheezes. She has no rhonchi. She has no rales.  Abdominal: Soft. Normal appearance and bowel sounds are normal. She exhibits no distension. There is tenderness in the right upper quadrant and epigastric area. There is positive Murphy's sign. There is no rigidity, no rebound, no guarding, no CVA tenderness and no tenderness at McBurney's point.  Soft, obese  which limits exam slightly but no obvious distension appreciated, +BS throughout, with moderate epigastric and RUQ TTP, no r/g/r, +murphy's, neg mcburney's, no CVA TTP. No appreciable hepatomegaly however body habitus limits exam.  Musculoskeletal: Normal range of motion.  Neurological: She is alert and oriented to person, place, and time. She has normal strength. No sensory deficit.  Skin: Skin is warm, dry and intact. No rash noted.  Psychiatric: She has a normal mood and affect.  Nursing note and vitals reviewed.    ED Treatments / Results  Labs (all labs ordered are listed, but only abnormal results are displayed) Labs Reviewed  COMPREHENSIVE METABOLIC PANEL - Abnormal; Notable for the following:       Result Value   Glucose, Bld 101 (*)    Total Bilirubin 0.2 (*)    All other components within normal limits  CBC - Abnormal; Notable for the following:    WBC 14.6 (*)    Hemoglobin 11.4 (*)    HCT 35.6 (*)    Platelets 480 (*)    All other components within normal limits  URINALYSIS, ROUTINE W REFLEX MICROSCOPIC - Abnormal; Notable for the following:  APPearance HAZY (*)    All other components within normal limits  DIFFERENTIAL - Abnormal; Notable for the following:    Neutro Abs 11.2 (*)    All other components within normal limits  LIPASE, BLOOD    EKG  EKG Interpretation None       Radiology No results found.  Procedures Procedures (including critical care time)  Medications Ordered in ED Medications  famotidine (PEPCID) IVPB 20 mg premix (20 mg Intravenous New Bag/Given 09/02/16 1937)  HYDROmorphone (DILAUDID) injection 1 mg (not administered)  promethazine (PHENERGAN) injection 25 mg (not administered)  morphine 4 MG/ML injection 4 mg (4 mg Intravenous Given 09/02/16 1937)  ondansetron (ZOFRAN) injection 4 mg (4 mg Intravenous Given 09/02/16 1937)  sodium chloride 0.9 % bolus 1,000 mL (1,000 mLs Intravenous New Bag/Given 09/02/16 1937)  gi cocktail  (Maalox,Lidocaine,Donnatal) (30 mLs Oral Given 09/02/16 1937)     Initial Impression / Assessment and Plan / ED Course  I have reviewed the triage vital signs and the nursing notes.  Pertinent labs & imaging results that were available during my care of the patient were reviewed by me and considered in my medical decision making (see chart for details).     31 y.o. female here with RUQ pain and nausea x6 days, worse after eating. Was seen in ED on 08/29/16 but left before being seen, labs that day were unremarkable except for WBC 17.5; [facility] for evaluation of RUQ pain. On exam, RUQ and epigastric TTP, +murphy's, obese but no obvious distension, unable to feel any hepatomegaly however body habitus limits exam. No flank tenderness. CBC with mild leukocytosis 14.6 and chronic stable anemia, will add on differential; CMP WNL, U/A unremarkable. Upreg negative a few days ago, doubt need for repeating this. Will add on differential, and get repeat lipase. Will get U/S to eval for biliary vs pancreas/kidney etiology. Will give pain meds, fluids, nausea meds, GI cocktail, and pepcid, then reassess shortly.   7:52 PM Differential with very slight neutrophilic predominance, however relative % of neuts actually WNL. Lipase WNL. U/S not yet done. Pain still intense, and nausea persisting despite meds; will give phenergan and dilaudid to see if we can help improve the symptoms. Patient care to be resumed by Ivar Drapeob Browning, PA-C at shift change sign-out. Patient history has been discussed with midlevel resuming care. Please see their notes for further documentation of pending results and dispo/care. Pt stable at sign-out and updated on transfer of care.    Final Clinical Impressions(s) / ED Diagnoses   Final diagnoses:  Colicky RUQ abdominal pain  Postprandial nausea  Gastritis, presence of bleeding unspecified, unspecified chronicity, unspecified gastritis type    Leukocytosis, unspecified type  Chronic anemia    New Prescriptions New Prescriptions   No medications on [address]     Kaysen Sefcik, ShilohMercedes, [place] JerseyPA-C 09/02/16 Corky Crafts1955    Raeford RazorKohut, Stephen, MD 09/05/16 2009

## 2016-09-02 NOTE — ED Notes (Addendum)
Pt feels like pain medicine is starting to work. Would like to hold off on additional pain medications at this time. PA notified.

## 2016-09-03 ENCOUNTER — Encounter (HOSPITAL_COMMUNITY): Payer: Self-pay | Admitting: Certified Registered"

## 2016-09-03 ENCOUNTER — Encounter (HOSPITAL_COMMUNITY): Admission: EM | Disposition: A | Discharge status: Home / Self Care | Payer: Self-pay | Source: Home / Self Care

## 2016-09-03 ENCOUNTER — Inpatient Hospital Stay (HOSPITAL_COMMUNITY): Payer: Self-pay | Admitting: Certified Registered Nurse Anesthetist

## 2016-09-03 HISTORY — PX: CHOLECYSTECTOMY: SHX55

## 2016-09-03 LAB — COMPREHENSIVE METABOLIC PANEL
ALK PHOS: 100 U/L (ref 38–126)
ALT: 23 U/L (ref 14–54)
AST: 14 U/L — AB (ref 15–41)
Albumin: 3.2 g/dL — ABNORMAL LOW (ref 3.5–5.0)
Anion gap: 8 (ref 5–15)
BILIRUBIN TOTAL: 0.3 mg/dL (ref 0.3–1.2)
CALCIUM: 8.7 mg/dL — AB (ref 8.9–10.3)
CHLORIDE: 104 mmol/L (ref 101–111)
CO2: 26 mmol/L (ref 22–32)
CREATININE: 0.58 mg/dL (ref 0.44–1.00)
GFR calc Af Amer: >60 mL/min (ref >60)
Glucose, Bld: 109 mg/dL — ABNORMAL HIGH (ref 65–99)
Potassium: 4 mmol/L (ref 3.5–5.1)
Sodium: 138 mmol/L (ref 135–145)
TOTAL PROTEIN: 6.6 g/dL (ref 6.5–8.1)

## 2016-09-03 LAB — CBC
HCT: 32.3 % — ABNORMAL LOW (ref 36.0–46.0)
Hemoglobin: 10.4 g/dL — ABNORMAL LOW (ref 12.0–15.0)
MCH: 28.7 pg (ref 26.0–34.0)
MCHC: 32.2 g/dL (ref 30.0–36.0)
MCV: 89 fL (ref 78.0–100.0)
PLATELETS: 400 10*3/uL (ref 150–400)
RBC: 3.63 MIL/uL — ABNORMAL LOW (ref 3.87–5.11)
RDW: 14.5 % (ref 11.5–15.5)
WBC: 11.4 10*3/uL — ABNORMAL HIGH (ref 4.0–10.5)

## 2016-09-03 LAB — HIV ANTIBODY (ROUTINE TESTING W REFLEX): HIV SCREEN 4TH GENERATION: NONREACTIVE

## 2016-09-03 LAB — SURGICAL PCR SCREEN
MRSA, PCR: NEGATIVE
Staphylococcus aureus: NEGATIVE

## 2016-09-03 SURGERY — LAPAROSCOPIC CHOLECYSTECTOMY WITH INTRAOPERATIVE CHOLANGIOGRAM
Anesthesia: General | Site: Abdomen

## 2016-09-03 MED ORDER — HYDROMORPHONE HCL 1 MG/ML IJ SOLN
0.2500 mg | INTRAMUSCULAR | Status: DC | PRN
  Administered 2016-09-03: 0.5 mg via INTRAVENOUS

## 2016-09-03 MED ORDER — MORPHINE SULFATE (PF) 4 MG/ML IV SOLN
1.0000 mg | INTRAVENOUS | Status: DC | PRN
  Administered 2016-09-03: 2 mg via INTRAVENOUS
  Filled 2016-09-03: qty 1

## 2016-09-03 MED ORDER — KETOROLAC TROMETHAMINE 30 MG/ML IJ SOLN
30.0000 mg | Freq: Four times a day (QID) | INTRAMUSCULAR | Status: DC | PRN
  Administered 2016-09-03 – 2016-09-04 (×3): 30 mg via INTRAVENOUS
  Filled 2016-09-03 (×3): qty 1

## 2016-09-03 MED ORDER — ACETAMINOPHEN 325 MG PO TABS
650.0000 mg | ORAL_TABLET | Freq: Four times a day (QID) | ORAL | Status: DC | PRN
Start: 2016-09-03 — End: 2016-09-04

## 2016-09-03 MED ORDER — ONDANSETRON HCL 4 MG/2ML IJ SOLN
INTRAMUSCULAR | Status: DC | PRN
  Administered 2016-09-03: 4 mg via INTRAVENOUS

## 2016-09-03 MED ORDER — PROMETHAZINE HCL 25 MG/ML IJ SOLN
6.2500 mg | INTRAMUSCULAR | Status: DC | PRN
  Administered 2016-09-03: 12.5 mg via INTRAVENOUS

## 2016-09-03 MED ORDER — PIPERACILLIN-TAZOBACTAM 3.375 G IVPB
3.3750 g | Freq: Three times a day (TID) | INTRAVENOUS | Status: DC
  Administered 2016-09-04 – 2016-09-06 (×8): 3.375 g via INTRAVENOUS
  Filled 2016-09-03 (×11): qty 50

## 2016-09-03 MED ORDER — SENNA 8.6 MG PO TABS
1.0000 | ORAL_TABLET | Freq: Two times a day (BID) | ORAL | Status: DC
  Administered 2016-09-04 – 2016-09-06 (×5): 8.6 mg via ORAL
  Filled 2016-09-03 (×6): qty 1

## 2016-09-03 MED ORDER — FENTANYL CITRATE (PF) 250 MCG/5ML IJ SOLN
INTRAMUSCULAR | Status: AC
  Filled 2016-09-03: qty 5

## 2016-09-03 MED ORDER — DIPHENHYDRAMINE HCL 50 MG/ML IJ SOLN: 12.5000 mg | Freq: Four times a day (QID) | INTRAMUSCULAR | Status: DC | PRN

## 2016-09-03 MED ORDER — PIPERACILLIN-TAZOBACTAM 3.375 G IVPB 30 MIN
3.3750 g | INTRAVENOUS | Status: AC
  Administered 2016-09-03: 3.375 g via INTRAVENOUS
  Filled 2016-09-03: qty 50

## 2016-09-03 MED ORDER — PROPOFOL 10 MG/ML IV BOLUS
INTRAVENOUS | Status: AC
Start: 2016-09-03 — End: 2016-09-03
  Filled 2016-09-03: qty 20

## 2016-09-03 MED ORDER — PROMETHAZINE HCL 25 MG/ML IJ SOLN
INTRAMUSCULAR | Status: AC
  Filled 2016-09-03: qty 1

## 2016-09-03 MED ORDER — DEXTROSE IN LACTATED RINGERS 5 % IV SOLN
INTRAVENOUS | Status: DC
  Administered 2016-09-03 (×3): via INTRAVENOUS

## 2016-09-03 MED ORDER — METHOCARBAMOL 500 MG PO TABS: 500.0000 mg | ORAL_TABLET | Freq: Four times a day (QID) | ORAL | Status: DC | PRN

## 2016-09-03 MED ORDER — BUPIVACAINE-EPINEPHRINE 0.25% -1:200000 IJ SOLN
INTRAMUSCULAR | Status: DC | PRN
  Administered 2016-09-03: 30 mL

## 2016-09-03 MED ORDER — IOPAMIDOL (ISOVUE-300) INJECTION 61%
INTRAVENOUS | Status: AC
  Filled 2016-09-03: qty 50

## 2016-09-03 MED ORDER — SODIUM CHLORIDE 0.9 % IV SOLN
INTRAVENOUS | Status: DC | PRN
  Administered 2016-09-03: 100 mL

## 2016-09-03 MED ORDER — LIDOCAINE HCL (CARDIAC) 20 MG/ML IV SOLN
INTRAVENOUS | Status: DC | PRN
  Administered 2016-09-03: 80 mg via INTRAVENOUS

## 2016-09-03 MED ORDER — OXYCODONE HCL 5 MG PO TABS
5.0000 mg | ORAL_TABLET | ORAL | Status: DC | PRN
  Administered 2016-09-04 – 2016-09-06 (×8): 10 mg via ORAL
  Filled 2016-09-03 (×9): qty 2

## 2016-09-03 MED ORDER — MEPERIDINE HCL 25 MG/ML IJ SOLN: 6.2500 mg | INTRAMUSCULAR | Status: DC | PRN

## 2016-09-03 MED ORDER — ONDANSETRON HCL 4 MG/2ML IJ SOLN
4.0000 mg | Freq: Four times a day (QID) | INTRAMUSCULAR | Status: DC | PRN
  Administered 2016-09-03 – 2016-09-06 (×8): 4 mg via INTRAVENOUS
  Filled 2016-09-03 (×8): qty 2

## 2016-09-03 MED ORDER — 0.9 % SODIUM CHLORIDE (POUR BTL) OPTIME
TOPICAL | Status: DC | PRN
  Administered 2016-09-03: 1000 mL

## 2016-09-03 MED ORDER — LACTATED RINGERS IV SOLN
INTRAVENOUS | Status: DC
  Administered 2016-09-03: 11:00:00 via INTRAVENOUS
  Administered 2016-09-03: 50 mL/h via INTRAVENOUS

## 2016-09-03 MED ORDER — PROMETHAZINE HCL 25 MG/ML IJ SOLN
12.5000 mg | Freq: Four times a day (QID) | INTRAMUSCULAR | Status: DC | PRN
  Administered 2016-09-03 – 2016-09-05 (×4): 12.5 mg via INTRAVENOUS
  Filled 2016-09-03 (×4): qty 1

## 2016-09-03 MED ORDER — ACETAMINOPHEN 650 MG RE SUPP: 650.0000 mg | Freq: Four times a day (QID) | RECTAL | Status: DC | PRN

## 2016-09-03 MED ORDER — SUGAMMADEX SODIUM 200 MG/2ML IV SOLN
INTRAVENOUS | Status: DC | PRN
  Administered 2016-09-03: 200 mg via INTRAVENOUS

## 2016-09-03 MED ORDER — OXYCODONE HCL 5 MG/5ML PO SOLN: 5.0000 mg | Freq: Once | ORAL | Status: DC | PRN

## 2016-09-03 MED ORDER — SODIUM CHLORIDE 0.9 % IR SOLN
Status: DC | PRN
  Administered 2016-09-03 (×2): 1000 mL

## 2016-09-03 MED ORDER — HYDROMORPHONE HCL 1 MG/ML IJ SOLN
INTRAMUSCULAR | Status: AC
  Filled 2016-09-03: qty 0.5

## 2016-09-03 MED ORDER — PIPERACILLIN-TAZOBACTAM 3.375 G IVPB 30 MIN
3.3750 g | Freq: Once | INTRAVENOUS | Status: AC
  Administered 2016-09-03: 3.375 g via INTRAVENOUS
  Filled 2016-09-03: qty 50

## 2016-09-03 MED ORDER — STERILE WATER FOR IRRIGATION IR SOLN
Status: DC | PRN
  Administered 2016-09-03: 1000 mL

## 2016-09-03 MED ORDER — OXYCODONE HCL 5 MG PO TABS
5.0000 mg | ORAL_TABLET | Freq: Once | ORAL | Status: DC | PRN
Start: 2016-09-03 — End: 2016-09-03

## 2016-09-03 MED ORDER — MIDAZOLAM HCL 5 MG/5ML IJ SOLN
INTRAMUSCULAR | Status: DC | PRN
  Administered 2016-09-03: 2 mg via INTRAVENOUS

## 2016-09-03 MED ORDER — ROCURONIUM BROMIDE 100 MG/10ML IV SOLN
INTRAVENOUS | Status: DC | PRN
  Administered 2016-09-03: 10 mg via INTRAVENOUS
  Administered 2016-09-03: 50 mg via INTRAVENOUS
  Administered 2016-09-03: 10 mg via INTRAVENOUS

## 2016-09-03 MED ORDER — SIMETHICONE 80 MG PO CHEW
40.0000 mg | CHEWABLE_TABLET | Freq: Four times a day (QID) | ORAL | Status: DC | PRN
  Administered 2016-09-05 – 2016-09-06 (×2): 40 mg via ORAL
  Filled 2016-09-03 (×2): qty 1

## 2016-09-03 MED ORDER — MORPHINE SULFATE (PF) 4 MG/ML IV SOLN
1.0000 mg | INTRAVENOUS | Status: DC | PRN
  Administered 2016-09-03: 4 mg via INTRAVENOUS
  Filled 2016-09-03: qty 1

## 2016-09-03 MED ORDER — MIDAZOLAM HCL 2 MG/2ML IJ SOLN
INTRAMUSCULAR | Status: AC
  Filled 2016-09-03: qty 2

## 2016-09-03 MED ORDER — ENOXAPARIN SODIUM 40 MG/0.4ML ~~LOC~~ SOLN
40.0000 mg | SUBCUTANEOUS | Status: DC
  Administered 2016-09-04 – 2016-09-06 (×3): 40 mg via SUBCUTANEOUS
  Filled 2016-09-03 (×3): qty 0.4

## 2016-09-03 MED ORDER — DIPHENHYDRAMINE HCL 12.5 MG/5ML PO ELIX: 12.5000 mg | ORAL_SOLUTION | Freq: Four times a day (QID) | ORAL | Status: DC | PRN

## 2016-09-03 MED ORDER — FENTANYL CITRATE (PF) 100 MCG/2ML IJ SOLN
INTRAMUSCULAR | Status: DC | PRN
  Administered 2016-09-03: 100 ug via INTRAVENOUS
  Administered 2016-09-03 (×3): 50 ug via INTRAVENOUS

## 2016-09-03 MED ORDER — PROPOFOL 10 MG/ML IV BOLUS
INTRAVENOUS | Status: DC | PRN
  Administered 2016-09-03: 170 mg via INTRAVENOUS

## 2016-09-03 MED ORDER — BUPIVACAINE-EPINEPHRINE (PF) 0.25% -1:200000 IJ SOLN
INTRAMUSCULAR | Status: AC
  Filled 2016-09-03: qty 30

## 2016-09-03 MED ORDER — ONDANSETRON 4 MG PO TBDP
4.0000 mg | ORAL_TABLET | Freq: Four times a day (QID) | ORAL | Status: DC | PRN
  Administered 2016-09-06: 4 mg via ORAL
  Filled 2016-09-03: qty 1

## 2016-09-03 MED ORDER — SUCCINYLCHOLINE CHLORIDE 20 MG/ML IJ SOLN
INTRAMUSCULAR | Status: DC | PRN
  Administered 2016-09-03: 120 mg via INTRAVENOUS

## 2016-09-03 SURGICAL SUPPLY — 61 items
APPLIER CLIP 5 13 M/L LIGAMAX5 (MISCELLANEOUS) ×3
BANDAGE ADH SHEER 1  50/CT (GAUZE/BANDAGES/DRESSINGS) ×3 IMPLANT
BENZOIN TINCTURE PRP APPL 2/3 (GAUZE/BANDAGES/DRESSINGS) ×3 IMPLANT
BIOPATCH RED 1 DISK 7.0 (GAUZE/BANDAGES/DRESSINGS) ×2 IMPLANT
BIOPATCH RED 1IN DISK 7.0MM (GAUZE/BANDAGES/DRESSINGS) ×1
BLADE CLIPPER SURG (BLADE) IMPLANT
CANISTER SUCT 3000ML PPV (MISCELLANEOUS) ×3 IMPLANT
CHLORAPREP W/TINT 26ML (MISCELLANEOUS) ×3 IMPLANT
CLIP APPLIE 5 13 M/L LIGAMAX5 (MISCELLANEOUS) ×1 IMPLANT
CLOSURE WOUND 1/2 X4 (GAUZE/BANDAGES/DRESSINGS) ×1
CONT SPEC 4OZ CLIKSEAL STRL BL (MISCELLANEOUS) ×3 IMPLANT
COVER MAYO STAND STRL (DRAPES) ×3 IMPLANT
COVER SURGICAL LIGHT HANDLE (MISCELLANEOUS) ×3 IMPLANT
DRAIN CHANNEL 19F RND (DRAIN) ×3 IMPLANT
DRAPE C-ARM 42X72 X-RAY (DRAPES) ×3 IMPLANT
DRSG TEGADERM 4X4.75 (GAUZE/BANDAGES/DRESSINGS) ×6 IMPLANT
ELECT REM PT RETURN 9FT ADLT (ELECTROSURGICAL) ×3
ELECTRODE REM PT RTRN 9FT ADLT (ELECTROSURGICAL) ×1 IMPLANT
EVACUATOR SILICONE 100CC (DRAIN) ×6 IMPLANT
GAUZE SPONGE 2X2 8PLY STRL LF (GAUZE/BANDAGES/DRESSINGS) ×1 IMPLANT
GLOVE BIO SURGEON STRL SZ7 (GLOVE) ×6 IMPLANT
GLOVE BIO SURGEON STRL SZ8 (GLOVE) ×3 IMPLANT
GLOVE BIOGEL M STRL SZ7.5 (GLOVE) ×3 IMPLANT
GLOVE BIOGEL PI IND STRL 6.5 (GLOVE) ×2 IMPLANT
GLOVE BIOGEL PI IND STRL 7.0 (GLOVE) ×2 IMPLANT
GLOVE BIOGEL PI IND STRL 7.5 (GLOVE) ×1 IMPLANT
GLOVE BIOGEL PI IND STRL 8 (GLOVE) ×4 IMPLANT
GLOVE BIOGEL PI INDICATOR 6.5 (GLOVE) ×4
GLOVE BIOGEL PI INDICATOR 7.0 (GLOVE) ×4
GLOVE BIOGEL PI INDICATOR 7.5 (GLOVE) ×2
GLOVE BIOGEL PI INDICATOR 8 (GLOVE) ×8
GLOVE ECLIPSE 7.5 STRL STRAW (GLOVE) ×6 IMPLANT
GLOVE SURG SS PI 6.5 STRL IVOR (GLOVE) ×6 IMPLANT
GOWN STRL REUS W/ TWL LRG LVL3 (GOWN DISPOSABLE) ×5 IMPLANT
GOWN STRL REUS W/ TWL XL LVL3 (GOWN DISPOSABLE) ×2 IMPLANT
GOWN STRL REUS W/TWL 2XL LVL3 (GOWN DISPOSABLE) ×3 IMPLANT
GOWN STRL REUS W/TWL LRG LVL3 (GOWN DISPOSABLE) ×10
GOWN STRL REUS W/TWL XL LVL3 (GOWN DISPOSABLE) ×4
GRASPER SUT TROCAR 14GX15 (MISCELLANEOUS) ×3 IMPLANT
KIT BASIN OR (CUSTOM PROCEDURE TRAY) ×3 IMPLANT
KIT ROOM TURNOVER OR (KITS) ×3 IMPLANT
NS IRRIG 1000ML POUR BTL (IV SOLUTION) ×3 IMPLANT
PAD ARMBOARD 7.5X6 YLW CONV (MISCELLANEOUS) ×3 IMPLANT
POUCH RETRIEVAL ECOSAC 10 (ENDOMECHANICALS) ×1 IMPLANT
POUCH RETRIEVAL ECOSAC 10MM (ENDOMECHANICALS) ×2
SCISSORS LAP 5X35 DISP (ENDOMECHANICALS) ×3 IMPLANT
SET CHOLANGIOGRAPH 5 50 .035 (SET/KITS/TRAYS/PACK) ×3 IMPLANT
SET IRRIG TUBING LAPAROSCOPIC (IRRIGATION / IRRIGATOR) ×3 IMPLANT
SLEEVE ENDOPATH XCEL 5M (ENDOMECHANICALS) ×6 IMPLANT
SPECIMEN JAR SMALL (MISCELLANEOUS) ×3 IMPLANT
SPONGE GAUZE 2X2 STER 10/PKG (GAUZE/BANDAGES/DRESSINGS) ×2
STRIP CLOSURE SKIN 1/2X4 (GAUZE/BANDAGES/DRESSINGS) ×2 IMPLANT
SUT ETHILON 2 0 FS 18 (SUTURE) ×3 IMPLANT
SUT MNCRL AB 4-0 PS2 18 (SUTURE) ×6 IMPLANT
SUT VICRYL 0 UR6 27IN ABS (SUTURE) ×9 IMPLANT
TOWEL OR 17X24 6PK STRL BLUE (TOWEL DISPOSABLE) ×3 IMPLANT
TOWEL OR 17X26 10 PK STRL BLUE (TOWEL DISPOSABLE) ×3 IMPLANT
TRAY LAPAROSCOPIC MC (CUSTOM PROCEDURE TRAY) ×3 IMPLANT
TROCAR XCEL BLUNT TIP 100MML (ENDOMECHANICALS) ×3 IMPLANT
TROCAR XCEL NON-BLD 5MMX100MML (ENDOMECHANICALS) ×3 IMPLANT
TUBING INSUFFLATION (TUBING) ×3 IMPLANT

## 2016-09-03 NOTE — Op Note (Signed)
[name] [id number] 1986/02/13 09/03/2016  Laparoscopic Cholecystectomy Procedure Note  Indications: This patient presents with symptomatic gallbladder disease and will undergo laparoscopic cholecystectomy.  Pre-operative Diagnosis: Calculus of gallbladder with acute cholecystitis, without mention of obstruction  Post-operative Diagnosis: severe calculous cholecystitis  Surgeon: Atilano Ina   Assistants: Violeta Gelinas MD  Anesthesia: General endotracheal anesthesia  Procedure Details  The patient was seen again in the Holding Room. The risks, benefits, complications, treatment options, and expected outcomes were discussed with the patient. The possibilities of reaction to medication, pulmonary aspiration, perforation of viscus, bleeding, recurrent infection, finding a normal gallbladder, the need for additional procedures, failure to diagnose a condition, the possible need to convert to an open procedure, and creating a complication requiring transfusion or operation were discussed with the patient. The likelihood of improving the patient's symptoms with return to their baseline status is good.  The patient and/or family concurred with the proposed plan, giving informed consent. The site of surgery properly noted. The patient was taken to Operating Room, identified as [name] and the procedure verified as Laparoscopic Cholecystectomy with Intraoperative Cholangiogram. A Time Out was held and the above information confirmed. Antibiotic prophylaxis was administered.   Prior to the induction of general anesthesia, antibiotic prophylaxis was administered. General endotracheal anesthesia was then administered and tolerated well. After the induction, the abdomen was prepped with Chloraprep and draped in the sterile fashion. The patient was positioned in the supine position.  Local anesthetic agent was injected into the skin near the umbilicus and an incision made. We dissected  down to the abdominal fascia with blunt dissection. She had a small pre-existing supraumbilical fascial defect. Her fascia was very attenuated. The fascia was further incised vertically and we entered the peritoneal cavity bluntly.  A pursestring suture of 0-Vicryl was placed around the fascial opening.  The Hasson cannula was inserted and secured with the stay suture.  Pneumoperitoneum was then created with CO2 and tolerated well without any adverse changes in the patient's vital signs. An 5-mm port was placed in the subxiphoid position.  Two 5-mm ports were placed in the right upper quadrant. All skin incisions were infiltrated with a local anesthetic agent before making the incision and placing the trocars.   We positioned the patient in reverse Trendelenburg, tilted slightly to the patient's left. The gallbladder was identified. It was very edematous and distended and thick-walled. There is omentum encasing it and partially obscuring it. The omentum was gently bluntly peeled away and away from the liver. There was some bleeding from the liver surface which was taken care of with electrocautery. The gallbladder was so distended that had to be aspirated in order to facilitate grasping it.  the fundus was grasped and retracted cephalad. Adhesions were lysed bluntly and with the electrocautery where indicated, taking care not to injure any adjacent organs or viscus. There is dense inflammation around the lower portion of the gallbladder and around the infundibulum. I was eventually able to identify the infundibulum and retract it laterally. It was somewhat difficult to retract because of a large gallstone in the infundibulum. The peritoneum over the triangle of calot was then divided and exposed in a blunt fashion. This took quite a while to do given the dense inflammation. Dr. Janee Morn had joined me in the operating room by that point. The node of calot was identified and stripped down. I first was able to  identify what appeared to be the cystic artery as it was directly behind the node.  It was circular eventually dissected out and around. I think continued dissection laterally around the infundibulum and we were eventually able to identify the cystic duct. We carefully mobilized and were able to circumvent gently dissect the cystic duct out and around. A critical view of the cystic duct and cystic artery was obtained.  The cystic duct was clearly identified and bluntly dissected circumferentially. Given the dense inflammation more proximately I decided just to take the cystic duct where it entered the infundibulum and not attempt to do a cholangiogram.  The cystic duct was then ligated with clips and divided. The cystic artery which had been identified & dissected free was ligated with clips and divided as well.   The gallbladder was dissected from the liver bed in retrograde fashion with the electrocautery. The posterior wall of the gallbladder was densely adherent and fused of the liver surface. The gallbladder was entered along with violation of the liver capsule. There is purulent spillage of fluid from the gallbladder. The gallbladder was removed and placed in an Ecco sac.  The large gallstone that was 2.9 cm on imaging fell of the gallbladder but was placed within the retrieval bag. The gallbladder and Ecco sac were then removed through the umbilical port site after enlarging the fascial incision. The liver bed was irrigated and inspected. Hemostasis was achieved with the electrocautery. Copious irrigation was utilized and was repeatedly aspirated until clear. A 19 [place]French Blake drain was then placed in the abdominal cavity and brought out through the lateral right sided abdominal wall trocar and secured to the skin with a 2-0 nylon suture. The drain was placed in the gallbladder fossa. The pursestring suture was used to close the umbilical fascia.  Still an air leak. 3 additional interrupted 0 Vicryl  sutures were placed in the umbilical fascia using the PMI suture passer.  We again inspected the right upper quadrant for hemostasis.  The umbilical closure was inspected and there was no air leak and nothing trapped within the closure. Pneumoperitoneum was released as we removed the trocars.  4-0 Monocryl was used to close the skin.   Benzoin, steri-strips, and clean dressings were applied. The patient was then extubated and brought to the recovery room in stable condition. Instrument, sponge, and needle counts were correct at closure and at the conclusion of the case.   Findings: Severe Cholecystitis with Cholelithiasis  Estimated Blood Loss: less than 50 mL         Drains: 19 French round Blake         Specimens: Gallbladder           Complications: None; patient tolerated the procedure well.         Disposition: PACU - hemodynamically stable.         Condition: stable  Mary SellaEric M. Andrey CampanileWilson, MD, FACS General, Bariatric, & Minimally Invasive Surgery [facility], [place]

## 2016-09-03 NOTE — Anesthesia Postprocedure Evaluation (Signed)
Anesthesia Post Note  Patient: Ottis StainMirian Lucas-Garcia  Procedure(s) Performed: Procedure(s) (LRB): LAPAROSCOPIC CHOLECYSTECTOMY (N/A)     Patient location during evaluation: PACU Anesthesia Type: General Level of consciousness: awake and alert Pain management: pain level controlled Vital Signs Assessment: post-procedure vital signs reviewed and stable Respiratory status: spontaneous breathing, nonlabored ventilation and respiratory function stable Cardiovascular status: blood pressure returned to baseline and stable Postop Assessment: no signs of nausea or vomiting Anesthetic complications: no    Last Vitals:  Vitals:   09/03/16 1330 09/03/16 1345  BP: 130/86 122/79  Pulse: 99 95  Resp: 17 15  Temp:  36.3 C    Last Pain:  Vitals:   09/03/16 1345  TempSrc:   PainSc: Asleep                 Lowella CurbWarren Ray Jarron Curley

## 2016-09-03 NOTE — Interval H&P Note (Signed)
History and Physical Interval Note:  09/03/2016 10:04 AM  [name]  has presented today for surgery, with the diagnosis of cholesithisis  The various methods of treatment have been discussed with the patient and family. After consideration of risks, benefits and other options for treatment, the patient has consented to  Procedure(s): LAPAROSCOPIC CHOLECYSTECTOMY WITH INTRAOPERATIVE CHOLANGIOGRAM (N/A) as a surgical intervention .  The patient's history has been reviewed, patient examined, no change in status, stable for surgery.  I have reviewed the patient's chart and labs.  Questions were answered to the patient's satisfaction.    Patient seen and examined Chart reviewed. Alert, Soft, obese, possible umbilical hernia, tender to palpation in right upper quadrant  Acute calculus cholecystitis I believe the patient's symptoms are consistent with gallbladder disease.  We discussed gallbladder disease. The patient was given Agricultural engineereducational material. We discussed non-operative and operative management. We discussed the signs & symptoms of acute cholecystitis  I discussed laparoscopic cholecystectomy with IOC in detail.  The patient was shown diagrams detailing the procedure.  We discussed the risks and benefits of a laparoscopic cholecystectomy including, but not limited to bleeding, infection, injury to surrounding structures such as the intestine or liver, bile leak, retained gallstones, need to convert to an open procedure, prolonged diarrhea, blood clots such as  DVT, common bile duct injury, anesthesia risks, and possible need for additional procedures.  We discussed the typical post-operative recovery course. I explained that the likelihood of improvement of their symptoms is good.  I explained to the patient and her husband that she was at higher risk for bile leak, common bile duct injury and conversion to open given the acute inflammation  Mary SellaEric Wong. Andrey CampanileWilson, MD, FACS General, Bariatric,  & Minimally Invasive Surgery [facility], [place]   [facility] Center[facility]ILSON,[name]

## 2016-09-03 NOTE — Anesthesia Preprocedure Evaluation (Addendum)
Anesthesia Evaluation  Patient identified by MRN, date of birth, ID band Patient awake    Reviewed: Allergy & Precautions, H&P , NPO status , Patient's Chart, lab work & pertinent test results  Airway Mallampati: II  TM Distance: >3 FB Neck ROM: full    Dental no notable dental hx.    Pulmonary neg pulmonary ROS, former smoker,    Pulmonary exam normal        Cardiovascular negative cardio ROS Normal cardiovascular exam     Neuro/Psych PSYCHIATRIC DISORDERS Depression negative neurological ROS     GI/Hepatic negative GI ROS, Neg liver ROS,   Endo/Other    Renal/GU negative Renal ROS  negative genitourinary   Musculoskeletal negative musculoskeletal ROS (+)   Abdominal (+) + obese,   Peds negative pediatric ROS (+)  Hematology negative hematology ROS (+)   Anesthesia Other Findings   Reproductive/Obstetrics                             Anesthesia Physical  Anesthesia Plan  ASA: II  Anesthesia Plan: General   Post-op Pain Management:    Induction: Intravenous, Rapid sequence and Cricoid pressure planned  Airway Management Planned: Oral ETT  Additional Equipment:   Intra-op Plan:   Post-operative Plan: Extubation in OR  Informed Consent: I have reviewed the patients History and Physical, chart, labs and discussed the procedure including the risks, benefits and alternatives for the proposed anesthesia with the patient or authorized representative who has indicated his/her understanding and acceptance.     Plan Discussed with: CRNA and Surgeon  Anesthesia Plan Comments:        Anesthesia Quick Evaluation

## 2016-09-03 NOTE — Transfer of Care (Signed)
Immediate Anesthesia Transfer of Care Note  Patient: [name]  Procedure(s) Performed: Procedure(s): LAPAROSCOPIC CHOLECYSTECTOMY (N/A)  Patient Location: PACU  Anesthesia Type:General  Level of Consciousness: awake, alert  and oriented  Airway & Oxygen Therapy: Patient Spontanous Breathing  Post-op Assessment: Report given to RN and Post -op Vital signs reviewed and stable  Post vital signs: Reviewed and stable  Last Vitals:  Vitals:   09/03/16 0034 09/03/16 0453  BP: 119/78 104/64  Pulse: 88 82  Resp: 18 18  Temp: 37 C 36.4 C    Last Pain:  Vitals:   09/03/16 0843  TempSrc:   PainSc: 5          Complications: No apparent anesthesia complications

## 2016-09-03 NOTE — ED Notes (Signed)
Report attempted x 1

## 2016-09-03 NOTE — Anesthesia Procedure Notes (Signed)
Procedure Name: Intubation Date/Time: 09/03/2016 10:46 AM Performed by: Manuela Schwartz B Pre-anesthesia Checklist: Patient identified, Emergency Drugs available, Suction available, Patient being monitored and Timeout performed Patient Re-evaluated:Patient Re-evaluated prior to inductionOxygen Delivery Method: Circle system utilized Preoxygenation: Pre-oxygenation with 100% oxygen Intubation Type: IV induction and Rapid sequence Laryngoscope Size: Mac and 3 Grade View: Grade I Tube type: Oral Tube size: 7.0 mm Number of attempts: 1 Airway Equipment and Method: Stylet Placement Confirmation: ETT inserted through vocal cords under direct vision,  positive ETCO2 and breath sounds checked- equal and bilateral Secured at: 20 cm Tube secured with: Tape Dental Injury: Teeth and Oropharynx as per pre-operative assessment

## 2016-09-04 ENCOUNTER — Encounter (HOSPITAL_COMMUNITY): Payer: Self-pay | Admitting: General Surgery

## 2016-09-04 LAB — CBC
HCT: 30.5 % — ABNORMAL LOW (ref 36.0–46.0)
HEMOGLOBIN: 9.9 g/dL — AB (ref 12.0–15.0)
MCH: 28.5 pg (ref 26.0–34.0)
MCHC: 32.5 g/dL (ref 30.0–36.0)
MCV: 87.9 fL (ref 78.0–100.0)
PLATELETS: 379 10*3/uL (ref 150–400)
RBC: 3.47 MIL/uL — ABNORMAL LOW (ref 3.87–5.11)
RDW: 14.4 % (ref 11.5–15.5)
WBC: 10.6 10*3/uL — ABNORMAL HIGH (ref 4.0–10.5)

## 2016-09-04 MED ORDER — KETOROLAC TROMETHAMINE 30 MG/ML IJ SOLN
30.0000 mg | Freq: Four times a day (QID) | INTRAMUSCULAR | Status: DC
  Administered 2016-09-04 – 2016-09-06 (×8): 30 mg via INTRAVENOUS
  Filled 2016-09-04 (×8): qty 1

## 2016-09-04 MED ORDER — ACETAMINOPHEN 325 MG PO TABS
650.0000 mg | ORAL_TABLET | Freq: Four times a day (QID) | ORAL | Status: DC
  Administered 2016-09-04 – 2016-09-06 (×9): 650 mg via ORAL
  Filled 2016-09-04 (×9): qty 2

## 2016-09-04 MED ORDER — ACETAMINOPHEN 650 MG RE SUPP: 650.0000 mg | Freq: Four times a day (QID) | RECTAL | Status: DC

## 2016-09-04 MED ORDER — SODIUM CHLORIDE 0.45 % IV SOLN
INTRAVENOUS | Status: DC
  Administered 2016-09-04 – 2016-09-05 (×3): via INTRAVENOUS
  Filled 2016-09-04: qty 1000

## 2016-09-04 MED ORDER — MORPHINE SULFATE (PF) 4 MG/ML IV SOLN: 1.0000 mg | INTRAVENOUS | Status: DC | PRN

## 2016-09-04 NOTE — Progress Notes (Signed)
Patient ID: [name], female   DOB: 02/25/1986, 31 y.o.   MRN: [id number]  [facility]  1 Day Post-Op  Subjective: CC- cholecystitis Continues to have significant abdominal pain. Worse with movement. On regular diet but has only taken in clears. Denies n/v. No flatus or BM.   Objective: Vital signs in last 24 hours: Temp:  [97.3 F (36.3 C)-98.6 F (37 C)] 98.3 F (36.8 C) (06/02 0516) Pulse Rate:  [75-99] 75 (06/02 0516) Resp:  [15-17] 15 (06/02 0516) BP: (99-130)/(54-86) 99/54 (06/02 0516) SpO2:  [93 %-95 %] 94 % (06/02 0516) Last BM Date: 09/01/16  Intake/Output from previous day: 06/01 0701 - 06/02 0700 In: 2493.8 [I.V.:2443.8; IV Piggyback:50] Out: 1020 [Urine:800; Drains:150; Blood:70] Intake/Output this shift: No intake/output data recorded.  PE: Gen:  Alert, NAD, pleasant Card:  RRR, no M/G/R heard Pulm:  CTAB, no W/R/R, effort normal Abd: Soft, ND, +BS, mild global tenderness most significant around incisions, lap incisions incisions C/D/I, drain with minimal serosanguinous fluid in bulb   Lab Results:   Recent Labs  09/03/16 0546 09/04/16 0555  WBC 11.4* 10.6*  HGB 10.4* 9.9*  HCT 32.3* 30.5*  PLT 400 379   BMET  Recent Labs  09/02/16 1745 09/03/16 0546  NA 135 138  K 4.2 4.0  CL 101 104  CO2 26 26  GLUCOSE 101* 109*  BUN 6 <5*  CREATININE 0.57 0.58  CALCIUM 9.1 8.7*   PT/INR No results for input(s): LABPROT, INR in the last 72 hours. CMP     Component Value Date/Time   NA 138 09/03/2016 0546   K 4.0 09/03/2016 0546   CL 104 09/03/2016 0546   CO2 26 09/03/2016 0546   GLUCOSE 109 (H) 09/03/2016 0546   BUN <5 (L) 09/03/2016 0546   CREATININE 0.58 09/03/2016 0546   CALCIUM 8.7 (L) 09/03/2016 0546   PROT 6.6 09/03/2016 0546   ALBUMIN 3.2 (L) 09/03/2016 0546   AST 14 (L) 09/03/2016 0546   ALT 23 09/03/2016 0546   ALKPHOS 100 09/03/2016 0546   BILITOT 0.3 09/03/2016 0546   GFRNONAA >60 09/03/2016  0546   GFRAA >60 09/03/2016 0546   Lipase     Component Value Date/Time   LIPASE 37 09/02/2016 1920       Studies/Results: [place] Abdomen Complete  Result Date: 09/02/2016 CLINICAL DATA:  31 year old female with right upper quadrant abdominal pain for 6 days. EXAM: ABDOMEN ULTRASOUND COMPLETE COMPARISON:  CT Abdomen and Pelvis 11/30/2009. FINDINGS: Gallbladder: Shadowing 2.9 cm echogenic gallstone within the neck of the gallbladder. Superimposed layering sludge. Abnormal gallbladder wall edema and thickening estimated at at least at 5 mm. Positive sonographic Murphy sign. Common bile duct: Diameter: 5 mm common normal. Liver: Mildly echogenic liver (image 37). No discrete liver lesion or intrahepatic biliary ductal dilatation. IVC: No abnormality visualized. Pancreas: Visualized portion unremarkable. Spleen: Size and appearance within normal limits. Right Kidney: Length: 12.5 cm. Echogenicity within normal limits. No mass or hydronephrosis visualized. Left Kidney: Length: 12.9 cm. Echogenicity within normal limits. No mass or hydronephrosis visualized. Abdominal aorta: Incompletely visualized due to overlying bowel gas, visualized portions within normal limits. Other findings: None. IMPRESSION: 1. Positive for Acute Cholecystitis. 2.9 cm gallstone lodged in the neck of the gallbladder with edematous gallbladder wall. 2. No evidence of CBD obstruction, and otherwise negative ultrasound appearance of the abdomen. Electronically Signed   By: Odessa FlemingH  Hall M.D.   On: 09/02/2016 21:52    Anti-infectives: Anti-infectives  Start     Dose/Rate Route Frequency Ordered Stop   09/03/16 1115  piperacillin-tazobactam (ZOSYN) IVPB 3.375 g     3.375 g 100 mL/hr over 30 Minutes Intravenous To Surgery 09/03/16 1107 09/03/16 1139   09/03/16 0800  piperacillin-tazobactam (ZOSYN) IVPB 3.375 g     3.375 g 12.5 mL/hr over 240 Minutes Intravenous Every 8 hours 09/03/16 0033     09/03/16 0045  piperacillin-tazobactam  (ZOSYN) IVPB 3.375 g     3.375 g 100 mL/hr over 30 Minutes Intravenous  Once 09/03/16 0041 09/03/16 0211       Assessment/Plan Severe calculous cholecystitis S/p lap chole 6/1 Dr. Andrey Campanile - POD 1 - drain with 150cc/24hr serosanguinous   VTE - SCDs, Lovenox FEN - IVF, regular diet ID - zosyn 6/1>>day#2  Plan - Schedule toradol and tylenol for better pain control. Ice for incisional pain. Please ambulate with patient. Decrease IVF but continue at 34mL/hr until taking in more PO. Continue drain and IV antibiotics. Labs in AM   LOS: 2 days    Edson Snowball , [facility] 09/04/2016, 8:48 AM Pager: [phone] Consults: [phone] Mon-Fri 7:00 am-4:30 pm Sat-Sun 7:00 am-11:30 am

## 2016-09-05 LAB — CBC
HEMATOCRIT: 28.8 % — AB (ref 36.0–46.0)
HEMOGLOBIN: 9.3 g/dL — AB (ref 12.0–15.0)
MCH: 28.6 pg (ref 26.0–34.0)
MCHC: 32.3 g/dL (ref 30.0–36.0)
MCV: 88.6 fL (ref 78.0–100.0)
Platelets: 382 10*3/uL (ref 150–400)
RBC: 3.25 MIL/uL — AB (ref 3.87–5.11)
RDW: 14.6 % (ref 11.5–15.5)
WBC: 9.5 10*3/uL (ref 4.0–10.5)

## 2016-09-05 LAB — BASIC METABOLIC PANEL
Anion gap: 6 (ref 5–15)
BUN: 8 mg/dL (ref 6–20)
CHLORIDE: 103 mmol/L (ref 101–111)
CO2: 26 mmol/L (ref 22–32)
Calcium: 8.1 mg/dL — ABNORMAL LOW (ref 8.9–10.3)
Creatinine, Ser: 0.54 mg/dL (ref 0.44–1.00)
GFR calc non Af Amer: >60 mL/min (ref >60)
Glucose, Bld: 88 mg/dL (ref 65–99)
POTASSIUM: 3.5 mmol/L (ref 3.5–5.1)
SODIUM: 135 mmol/L (ref 135–145)

## 2016-09-05 MED ORDER — METHOCARBAMOL 500 MG PO TABS
500.0000 mg | ORAL_TABLET | Freq: Three times a day (TID) | ORAL | Status: DC
  Administered 2016-09-05 – 2016-09-06 (×4): 500 mg via ORAL
  Filled 2016-09-05 (×4): qty 1

## 2016-09-05 MED ORDER — BOOST / RESOURCE BREEZE PO LIQD
1.0000 | Freq: Two times a day (BID) | ORAL | Status: DC
Start: 2016-09-05 — End: 2016-09-06
  Administered 2016-09-05 (×2): 1 via ORAL

## 2016-09-05 NOTE — Progress Notes (Signed)
Patient ID: [name], female   DOB: 1986-02-16, 31 y.o.   MRN: [id number]  [facility] Progress Note  2 Days Post-Op  Subjective: CC- abdominal pain Sitting up in chair this morning. States that she is still having significant RUQ pain. Describes the pain as stabbing/crampy. Taking in very little PO. She reports some mild persistent nausea. No emesis. She is passing flatus.  Objective: Vital signs in last 24 hours: Temp:  [97.7 F (36.5 C)-98.8 F (37.1 C)] 98.2 F (36.8 C) (06/03 0448) Pulse Rate:  [73-89] 76 (06/03 0448) Resp:  [16-20] 20 (06/03 0448) BP: (93-109)/(54-69) 109/69 (06/03 0448) SpO2:  [90 %-96 %] 93 % (06/03 0448) Last BM Date: 09/01/16  Intake/Output from previous day: 06/02 0701 - 06/03 0700 In: 891.3 [P.O.:120; I.V.:671.3; IV Piggyback:100] Out: 200 [Drains:200] Intake/Output this shift: No intake/output data recorded.  PE: Gen:  Alert, NAD, pleasant Card:  RRR, no M/G/R heard Pulm:  CTAB, no W/R/R, effort normal Abd: Soft, obese, ND, +BS, mild global tenderness most significant in RUQ, lap incisions incisions C/D/I, drain with minimal serosanguinous fluid in bulb  Lab Results:   Recent Labs  09/04/16 0555 09/05/16 0356  WBC 10.6* 9.5  HGB 9.9* 9.3*  HCT 30.5* 28.8*  PLT 379 382   BMET  Recent Labs  09/03/16 0546 09/05/16 0356  NA 138 135  K 4.0 3.5  CL 104 103  CO2 26 26  GLUCOSE 109* 88  BUN <5* 8  CREATININE 0.58 0.54  CALCIUM 8.7* 8.1*   PT/INR No results for input(s): LABPROT, INR in the last 72 hours. CMP     Component Value Date/Time   NA 135 09/05/2016 0356   K 3.5 09/05/2016 0356   CL 103 09/05/2016 0356   CO2 26 09/05/2016 0356   GLUCOSE 88 09/05/2016 0356   BUN 8 09/05/2016 0356   CREATININE 0.54 09/05/2016 0356   CALCIUM 8.1 (L) 09/05/2016 0356   PROT 6.6 09/03/2016 0546   ALBUMIN 3.2 (L) 09/03/2016 0546   AST 14 (L) 09/03/2016 0546   ALT 23 09/03/2016 0546   ALKPHOS 100 09/03/2016 0546    BILITOT 0.3 09/03/2016 0546   GFRNONAA >60 09/05/2016 0356   GFRAA >60 09/05/2016 0356   Lipase     Component Value Date/Time   LIPASE 37 09/02/2016 1920       Studies/Results: No results found.  Anti-infectives: Anti-infectives    Start     Dose/Rate Route Frequency Ordered Stop   09/03/16 1115  piperacillin-tazobactam (ZOSYN) IVPB 3.375 g     3.375 g 100 mL/hr over 30 Minutes Intravenous To Surgery 09/03/16 1107 09/03/16 1139   09/03/16 0800  piperacillin-tazobactam (ZOSYN) IVPB 3.375 g     3.375 g 12.5 mL/hr over 240 Minutes Intravenous Every 8 hours 09/03/16 0033     09/03/16 0045  piperacillin-tazobactam (ZOSYN) IVPB 3.375 g     3.375 g 100 mL/hr over 30 Minutes Intravenous  Once 09/03/16 0041 09/03/16 0211       Assessment/Plan Severe calculous cholecystitis S/p lap chole 6/1 Dr. Andrey Campanile - POD 2 - drain with 200cc/24hr serosanguinous   VTE - SCDs, Lovenox FEN - IVF, regular diet ID - zosyn 6/1>>day#3. Needs 5 total days of abx  Plan - Schedule robaxin to help with pain control. Continue ice and antiinflammatory. Continue to encourage ambulation. Continue drain and IV antibiotics. May be ready for discharge tomorrow pending improvement in pain and PO intake.    LOS: 3 days  Edson SnowballBROOKE A MILLER , [facility] 09/05/2016, 8:41 AM Pager: [phone] Consults: [phone] Mon-Fri 7:00 am-4:30 pm Sat-Sun 7:00 am-11:30 am

## 2016-09-06 MED ORDER — ONDANSETRON 4 MG PO TBDP: 4.0000 mg | ORAL_TABLET | Freq: Four times a day (QID) | ORAL | 0 refills | Status: DC | PRN

## 2016-09-06 MED ORDER — SENNA 8.6 MG PO TABS: 1.0000 | ORAL_TABLET | Freq: Two times a day (BID) | ORAL | 0 refills | Status: DC

## 2016-09-06 MED ORDER — AMOXICILLIN-POT CLAVULANATE 875-125 MG PO TABS: 1.0000 | ORAL_TABLET | Freq: Two times a day (BID) | ORAL | 0 refills | Status: AC

## 2016-09-06 MED ORDER — METHOCARBAMOL 500 MG PO TABS: 500.0000 mg | ORAL_TABLET | Freq: Three times a day (TID) | ORAL | 0 refills | Status: DC | PRN

## 2016-09-06 MED ORDER — OXYCODONE HCL 5 MG PO TABS: 5.0000 mg | ORAL_TABLET | Freq: Four times a day (QID) | ORAL | 0 refills | Status: DC | PRN

## 2016-09-06 NOTE — Discharge Summary (Signed)
  [facility] Discharge Summary   Patient ID: [name] MRN: [id number] DOB/AGE: 33/07/1985 33 y.o.  Admit date: 09/02/2016 Discharge date: 09/06/2016  Admitting Diagnosis: Cholecystitis  Discharge Diagnosis Patient Active Problem List   Diagnosis Date Noted  . Acute calculous cholecystitis 09/02/2016  . Pregnancy complicated by IUD (intrauterine device) 10/16/2010    Consultants None  Imaging: US abdomen 09/02/16: 1. Positive for Acute Cholecystitis. 2.9 cm gallstone lodged in the neck of the gallbladder with edematous gallbladder wall. 2. No evidence of CBD obstruction, and otherwise negative ultrasound appearance of the abdomen.  Procedures Dr. Andrey CampanileWilson (09/03/16) - Laparoscopic Cholecystectomy  Hospital Course:  [name] is a 31yo female who [facility] with 5 days of waxing/waning upper abdominal pain associated with nausea and vomiting.  Workup showed acute calculous cholecystitis on ultrasound.  Patient was admitted and underwent procedure listed above.  Tolerated procedure well and was transferred to the floor.  Patient was very sore postoperatively, but pain slowly improved. Diet was advanced as tolerated.  On POD3 the patient was voiding well, tolerating diet, ambulating well, pain well controlled, vital signs stable, incisions c/d/i and felt stable for discharge home.  Patient will follow up in our office at the end of the week for drain removal. She will be on 3 additional days of augmentin. Patient knows to call with questions or concerns.  I have personally reviewed the patients medication history on the [place] controlled substance database.    Physical Exam: Gen:  Alert, NAD, pleasant Card:  RRR, no M/G/R heard Pulm:  CTAB, no W/R/R, effort normal Abd: Soft, ND, +BS, mild global tenderness, lap incisions C/D/I, drain with minimal serosanguinous fluid in bulb  Allergies as of 09/06/2016   No Known Allergies      Medication List    TAKE these medications   amoxicillin-clavulanate 875-125 MG tablet Commonly known as:  AUGMENTIN Take 1 tablet by mouth 2 (two) times daily.   methocarbamol 500 MG tablet Commonly known as:  ROBAXIN Take 1 tablet (500 mg total) by mouth every 8 (eight) hours as needed for muscle spasms.   ondansetron 4 MG disintegrating tablet Commonly known as:  ZOFRAN-ODT Take 1 tablet (4 mg total) by mouth every 6 (six) hours as needed for nausea.   oxyCODONE 5 MG immediate release tablet Commonly known as:  Oxy IR/ROXICODONE Take 1-2 tablets (5-10 mg total) by mouth every 6 (six) hours as needed for moderate pain.   senna 8.6 MG Tabs tablet Commonly known as:  SENOKOT Take 1 tablet (8.6 mg total) by mouth 2 (two) times daily.        Follow-up Information    [facility] [place] Go to.   Specialty:  General Surgery Why:  You have 2 appointments: 1. 09/10/16 at [facility] with one of out nurses to have your drain removed. Please arrive 30 minutes prior to your appointment to fill out paperwork. 2. 09/23/2016 at 2:30PM for followup from your surgery, arrive 15 minutes early Contact information: [address] [place] [id number] [phone]          Signed: Edson SnowballBROOKE A MILLER, [facility] 09/06/2016, 10:44 AM Pager: [phone] Consults: [phone] Mon-Fri 7:00 am-4:30 pm Sat-Sun 7:00 am-11:30 am

## 2016-09-06 NOTE — Progress Notes (Signed)
Belinda StainMirian Lucas-Garcia to be D/C'd  per MD order. Discussed with the patient and all questions fully answered.  VSS, Skin clean, dry and intact without evidence of skin break down, no evidence of skin tears noted.  IV catheter discontinued intact. Site without signs and symptoms of complications. Dressing and pressure applied.  An After Visit Summary was printed and given to the patient. Patient received prescription.  AVS gone over using [name] as a spanish interpreter.  A copy of the spanish and english AVS were both given to the patient as well as a JP drain information sheet.   D/c education completed with patient/family including follow up instructions, medication list, d/c activities limitations if indicated, with other d/c instructions as indicated by MD - patient able to verbalize understanding, all questions fully answered.   Patient instructed to return to ED, call 911, or call MD for any changes in condition.   Patient to be escorted via WC, and D/C home via private auto.

## 2016-09-06 NOTE — Care Management Note (Addendum)
Case Management Note  Patient Details  Name: [name] MRN: [id number] Date of Birth: 02/25/1986  Subjective/Objective:                    Action/Plan: MATCH letter given and explained to patient and visitor.   [facility] information given and explained also.   Patient and visitor both voiced understanding. Expected Discharge Date:                  Expected Discharge Plan:  Home/Self Care  In-House Referral:     Discharge planning Services  CM Consult, MATCH Program, Medication Assistance, Indigent Health Clinic  Post Acute Care Choice:    Choice offered to:     DME Arranged:    DME Agency:     HH Arranged:    HH Agency:     Status of Service:  Completed   If discussed at [employer] Meetings, dates discussed:    Additional Comments:  Kingsley PlanWile, Killian Schwer Marie, RN 09/06/2016, 10:10 AM

## 2016-09-06 NOTE — Discharge Instructions (Addendum)
Surgical Drain Home Care °Surgical drains are used to remove extra fluid that normally builds up in a surgical wound after surgery. A surgical drain helps to heal a surgical wound. Different kinds of surgical drains include: °· Active drains. These drains use suction to pull drainage away from the surgical wound. Drainage flows through a tube to a container outside of the body. It is important to keep the bulb or the drainage container flat (compressed) at all times, except while you empty it. Flattening the bulb or container creates suction. The two most common types of active drains are bulb drains and Hemovac drains. °· Passive drains. These drains allow fluid to drain naturally, by gravity. Drainage flows through a tube to a bandage (dressing) or a container outside of the body. Passive drains do not need to be emptied. The most common type of passive drain is the Penrose drain. ° °A drain is placed during surgery. Immediately after surgery, drainage is usually bright red and a little thicker than water. The drainage may gradually turn yellow or pink and become thinner. It is likely that your health care provider will remove the drain when the drainage stops or when the amount decreases to 1-2 Tbsp (15-30 mL) during a 24-hour period. °How to care for your surgical drain °· Keep the skin around the drain dry and covered with a dressing at all times. °· Check your drain area every day for signs of infection. Check for: °? More redness, swelling, or pain. °? Pus or a bad smell. °? Cloudy drainage. °Follow instructions from your health care provider about how to take care of your drain and how to change your dressing. Change your dressing at least one time every day. Change it more often if needed to keep the dressing dry. Make sure you: °1. Gather your supplies, including: °? Tape. °? Germ-free cleaning solution (sterile saline). °? Split gauze drain sponge: 4 x 4 inches (10 x 10 cm). °? Gauze square: 4 x 4 inches  (10 x 10 cm). °2. Wash your hands with soap and water before you change your dressing. If soap and water are not available, use hand sanitizer. °3. Remove the old dressing. Avoid using scissors to do that. °4. Use sterile saline to clean your skin around the drain. °5. Place the tube through the slit in a drain sponge. Place the drain sponge so that it covers your wound. °6. Place the gauze square or another drain sponge on top of the drain sponge that is on the wound. Make sure the tube is between those layers. °7. Tape the dressing to your skin. °8. If you have an active bulb or Hemovac drain, tape the drainage tube to your skin 1-2 inches (2.5-5 cm) below the place where the tube enters your body. Taping keeps the tube from pulling on any stitches (sutures) that you have. °9. Wash your hands with soap and water. °10. Write down the color of your drainage and how often you change your dressing. ° °How to empty your active bulb or Hemovac drain °1. Make sure that you have a measuring cup that you can empty your drainage into. °2. Wash your hands with soap and water. If soap and water are not available, use hand sanitizer. °3. Gently move your fingers down the tube while squeezing very lightly. This is called stripping the tube. This clears any drainage, clots, or tissue from the tube. °? Do not pull on the tube. °? You may need to strip   the tube several times every day to keep the tube clear. °4. Open the bulb cap or the drain plug. Do not touch the inside of the cap or the bottom of the plug. °5. Empty all of the drainage into the measuring cup. °6. Compress the bulb or the container and replace the cap or the plug. To compress the bulb or the container, squeeze it firmly in the middle while you close the cap or plug the container. °7. Write down the amount of drainage that you have in each 24-hour period. If you have less than 2 Tbsp (30 mL) of drainage during 24 hours, contact your health care  provider. °8. Flush the drainage down the toilet. °9. Wash your hands with soap and water. °Contact a health care provider if: °· You have more redness, swelling, or pain around your drain area. °· The amount of drainage that you have is increasing instead of decreasing. °· You have pus or a bad smell coming from your drain area. °· You have a fever. °· You have drainage that is cloudy. °· There is a sudden stop or a sudden decrease in the amount of drainage that you have. °· Your tube falls out. °· Your active drain does not stay compressed after you empty it. °This information is not intended to replace advice given to you by your health care provider. Make sure you discuss any questions you have with your health care provider. °Document Released: 03/19/2000 Document Revised: 08/28/2015 Document Reviewed: 10/09/2014 °Elsevier Interactive Patient Education © 2018 [employer] ° ° °Please arrive at least 30 min before your appointment to complete your check in paperwork.  If you are unable to arrive 30 min prior to your appointment time we may have to cancel or reschedule you. ° °LAPAROSCOPIC SURGERY: POST OP INSTRUCTIONS  °1. DIET: Follow a light bland diet the first 24 hours after arrival home, such as soup, liquids, crackers, etc. Be sure to include lots of fluids daily. Avoid fast food or heavy meals as your are more likely to get nauseated. Eat a low fat the next few days after surgery.  °2. Take your usually prescribed home medications unless otherwise directed. °3. PAIN CONTROL:  °1. Pain is best controlled by a usual combination of three different methods TOGETHER:  °1. Ice/Heat °2. Over the counter pain medication °3. Prescription pain medication °2. Most patients will experience some swelling and bruising around the incisions. Ice packs or heating pads (30-60 minutes up to 6 times a day) will help. Use ice for the first few days to help decrease swelling and bruising, then switch to heat to help relax  tight/sore spots and speed recovery. Some people prefer to use ice alone, heat alone, alternating between ice & heat. Experiment to what works for you. Swelling and bruising can take several weeks to resolve.  °3. It is helpful to take an over-the-counter pain medication regularly for the first few weeks. Choose one of the following that works best for you:  °1. Naproxen (Aleve, etc) Two 220mg tabs twice a day °2. Ibuprofen (Advil, etc) Three 200mg tabs four times a day (every meal & bedtime) °3. Acetaminophen (Tylenol, etc) 500-650mg four times a day (every meal & bedtime) °4. A prescription for pain medication (such as oxycodone, hydrocodone, etc) should be given to you upon discharge. Take your pain medication as prescribed.  °1. If you are having problems/concerns with the prescription medicine (does not control pain, nausea, vomiting, rash, itching, etc), please call us [phone] to see if we need to switch you to a different pain medicine that will work better for you and/or control your side effect better. °2. If you need a refill on your pain medication, please contact your pharmacy. They will contact our office to request authorization. Prescriptions will not be filled after 5 pm or on week-ends. °4. Avoid getting constipated. Between the surgery and the pain medications, it is common to experience some constipation. Increasing fluid intake and taking a fiber supplement (such as Metamucil, Citrucel, FiberCon, MiraLax, etc) 1-2 times a day regularly will usually help prevent this problem from occurring. A mild laxative (prune juice, Milk of Magnesia, MiraLax, etc) should be taken according to package directions if there are no bowel movements after 48 hours.  °5. Watch out for diarrhea. If you have many loose bowel movements, simplify your diet to bland foods & liquids for a few days. Stop any stool softeners and decrease your fiber supplement. Switching to mild anti-diarrheal medications (Kayopectate,  Pepto Bismol) can help. If this worsens or does not improve, please call us. °6. Wash / shower every day. You may shower over the dressings as they are waterproof. Continue to shower over incision(s) after the dressing is off. If there is glue over the incisions try not to pick it off, let it fall off naturally. °7. Remove your waterproof bandages 2 days after surgery. You may leave the incision open to air. You may replace a dressing/Band-Aid to cover the incision for comfort if you wish.  °8. ACTIVITIES as tolerated:  °1. You may resume regular (light) daily activities beginning the next day--such as daily self-care, walking, climbing stairs--gradually increasing activities as tolerated. If you can walk 30 minutes without difficulty, it is safe to try more intense activity such as jogging, treadmill, bicycling, low-impact aerobics, swimming, etc. °2. Save the most intensive and strenuous activity for last such as sit-ups, heavy lifting, contact sports, etc Refrain from any heavy lifting or straining until you are off narcotics for pain control. For the first 2-3 weeks do not lift over 10-15lb.  °3. DO NOT PUSH THROUGH PAIN. Let pain be your guide: If it hurts to do something, don't do it. Pain is your body warning you to avoid that activity for another week until the pain goes down. °4. You may drive when you are no longer taking prescription pain medication, you can comfortably wear a seatbelt, and you can safely maneuver your car and apply brakes. °5. You may have sexual intercourse when it is comfortable.  °9. FOLLOW UP in our office  °1. Please call [facility] at [phone] to set up an appointment to see your surgeon in the office for a follow-up appointment approximately 2-3 weeks after your surgery. °2. Make sure that you call for this appointment the day you arrive home to insure a convenient appointment time. °     10. IF YOU HAVE DISABILITY OR FAMILY LEAVE FORMS, BRING THEM TO THE               OFFICE FOR  PROCESSING.  ° °WHEN TO CALL US [phone]:  °1. Poor pain control °2. Reactions / problems with new medications (rash/itching, nausea, etc)  °3. Fever over 101.5 F (38.5 C) °4. Inability to urinate °5. Nausea and/or vomiting °6. Worsening swelling or bruising °7. Continued bleeding from incision. °8. Increased pain, redness, or drainage from the incision ° °The clinic staff is available to answer your questions during regular business hours (  8:30am-5pm). Please don’t hesitate to call and ask to speak to one of our nurses for clinical concerns.  °If you have a medical emergency, go to the nearest emergency room or call 911.  °A surgeon from [facility] is always on call at the hospitals  ° °[facility]  °[address], [address], [place], [place] [address] ?  °MAIN: [phone] ? TOLL FREE: [phone] ?  °FAX [phone]  °[email or web address] ° ° °

## 2016-09-24 ENCOUNTER — Other Ambulatory Visit (HOSPITAL_COMMUNITY): Payer: Self-pay | Admitting: Student

## 2016-09-24 DIAGNOSIS — Z9049 Acquired absence of other specified parts of digestive tract: Secondary | ICD-10-CM

## 2016-09-29 ENCOUNTER — Ambulatory Visit (HOSPITAL_COMMUNITY)
Admission: RE | Admit: 2016-09-29 | Discharge: 2016-09-29 | Disposition: A | Discharge status: Home / Self Care | Payer: Self-pay | Source: Ambulatory Visit | Attending: Student | Admitting: Student

## 2016-09-29 ENCOUNTER — Encounter (HOSPITAL_COMMUNITY): Payer: Self-pay

## 2016-09-29 DIAGNOSIS — K828 Other specified diseases of gallbladder: Secondary | ICD-10-CM | POA: Insufficient documentation

## 2016-09-29 DIAGNOSIS — Z9049 Acquired absence of other specified parts of digestive tract: Secondary | ICD-10-CM | POA: Insufficient documentation

## 2016-09-29 MED ORDER — IOPAMIDOL (ISOVUE-300) INJECTION 61%
INTRAVENOUS | Status: AC
  Filled 2016-09-29: qty 100

## 2016-09-29 MED ORDER — IOPAMIDOL (ISOVUE-300) INJECTION 61%
100.0000 mL | Freq: Once | INTRAVENOUS | Status: AC | PRN
  Administered 2016-09-29: 100 mL via INTRAVENOUS

## 2017-11-21 IMAGING — CT CT ABD-PELV W/ CM
2 of 5 series · 16 of 46 positions shown, 18 images · IV contrast (ISOVUE 300)
Comparison: 09/02/2016 ultrasound and prior studies, including
11/30/2009 CT

CLINICAL DATA: 30-year-old female with upper abdominal pain, weight
loss and nausea following cholecystectomy on 09/03/2016.

EXAM:
CT ABDOMEN AND PELVIS WITH CONTRAST
TECHNIQUE: Multidetector CT imaging of the abdomen and pelvis was performed
using the standard protocol following bolus administration of
intravenous contrast.
CONTRAST:  100mL 2USJ7O-UXX IOPAMIDOL (2USJ7O-UXX) INJECTION 61%

[Series 2: axial st · axial · 0.81mm/px · z∈[-682,-292]mm · 13 of 92 slices shown, 15 images]
[im 7/92  soft-tissue]
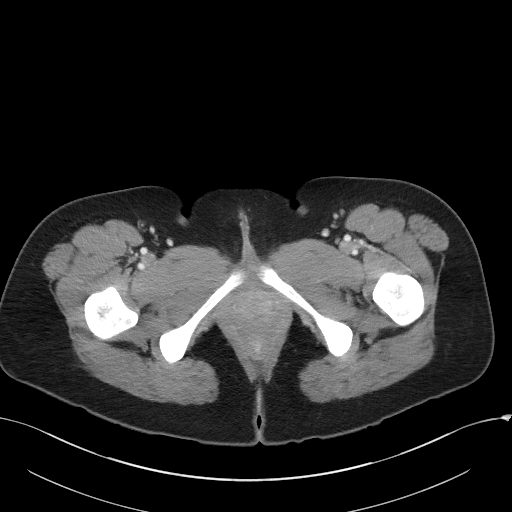
[im 7/92  bone]
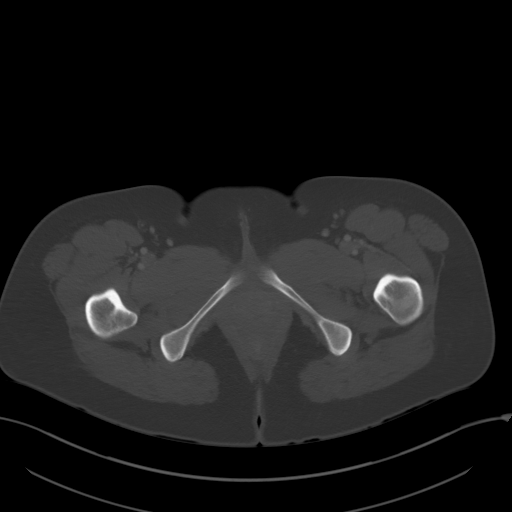
[im 13/92  soft-tissue]
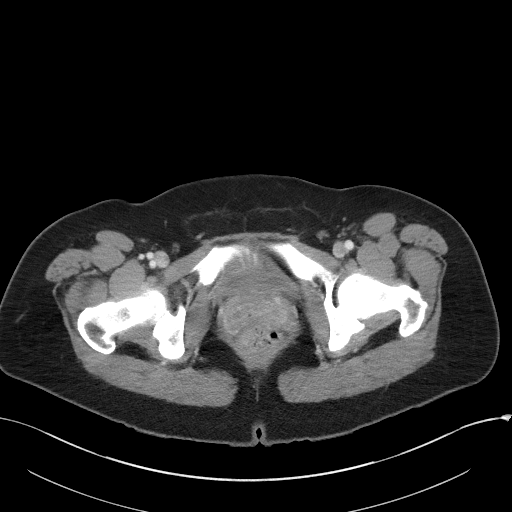
[im 19/92  soft-tissue]
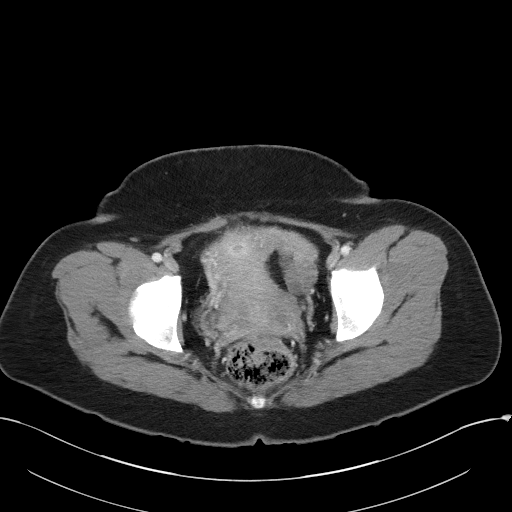
[im 25/92  soft-tissue]
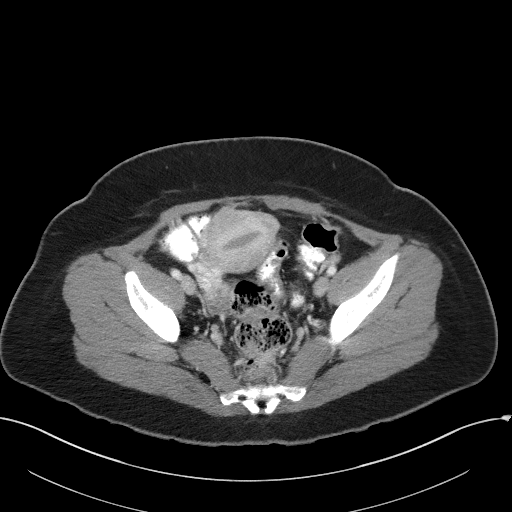
[im 31/92  soft-tissue]
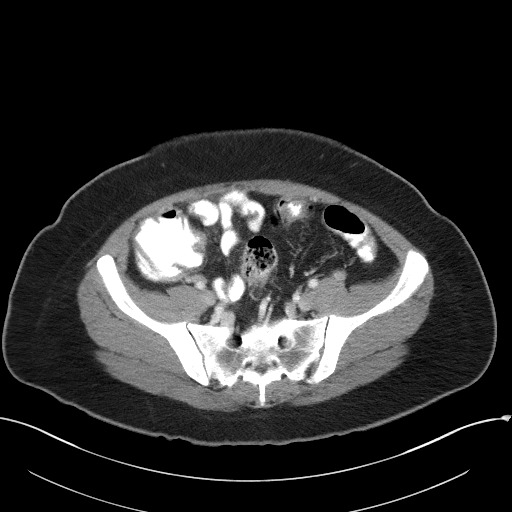
[im 37/92  soft-tissue]
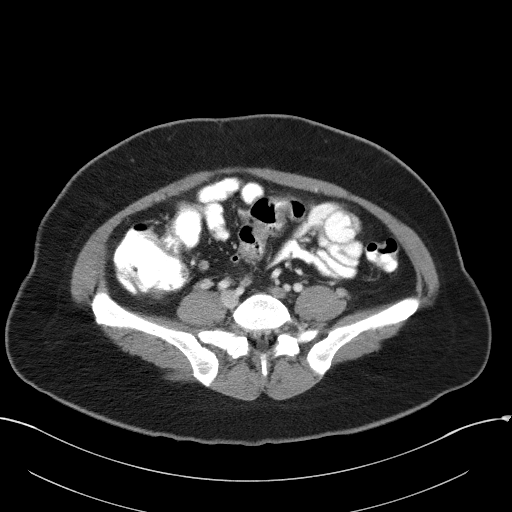
[im 49/92  soft-tissue]
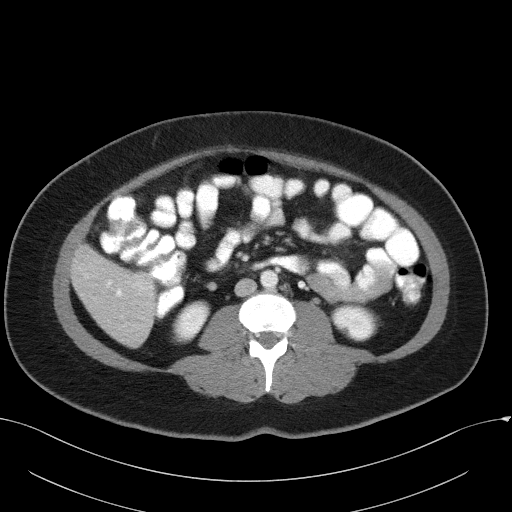
[im 55/92  soft-tissue]
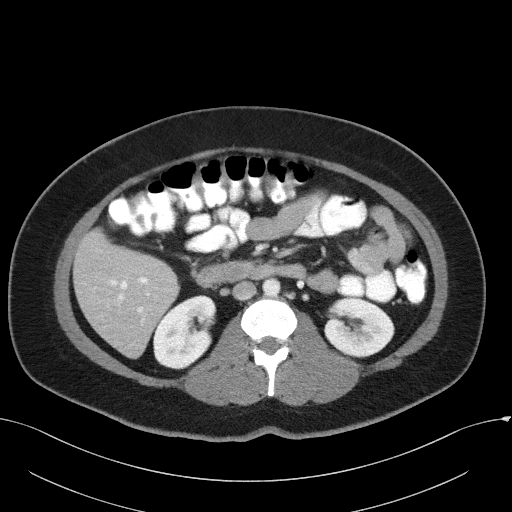
[im 61/92  soft-tissue]
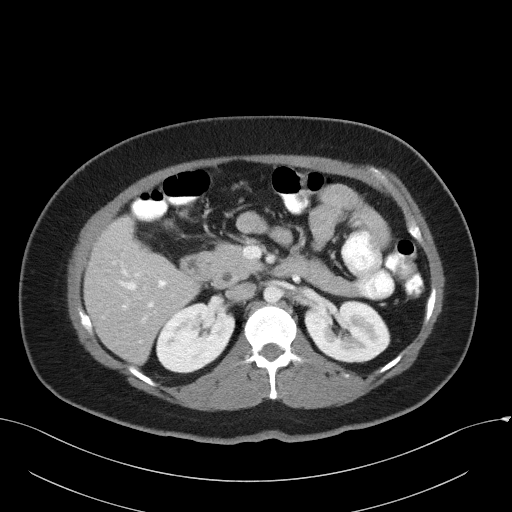
[im 61/92  bone]
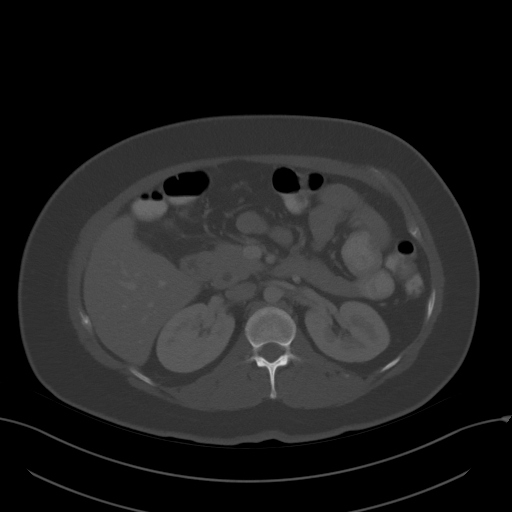
[im 67/92  soft-tissue]
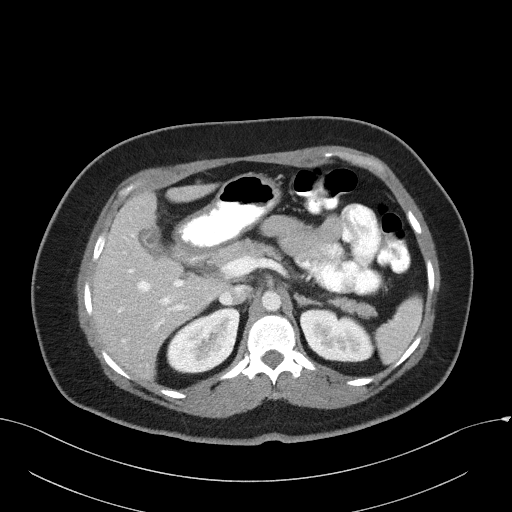
[im 73/92  soft-tissue]
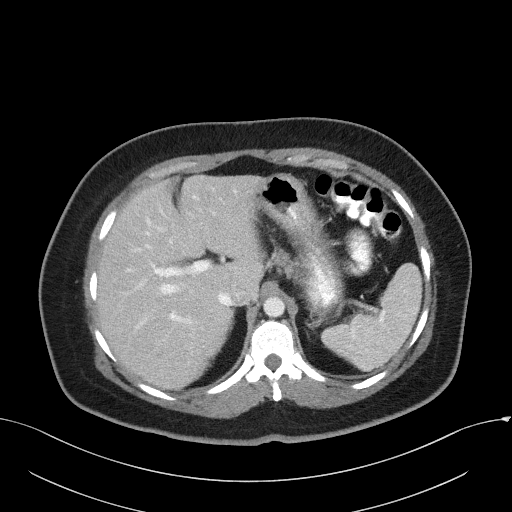
[im 79/92  soft-tissue]
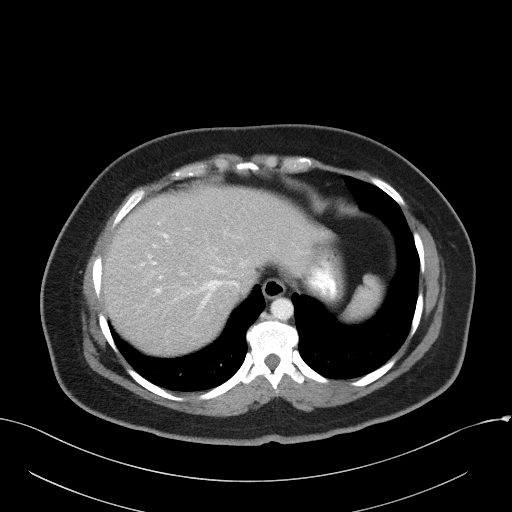
[im 85/92  soft-tissue]
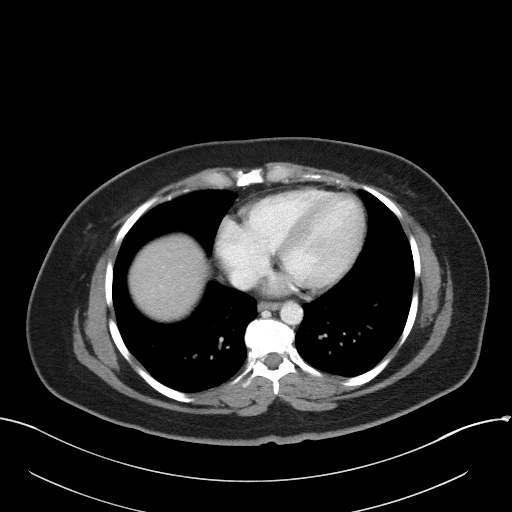

[Series 4: coronal st · coronal · 0.82mm/px · 3 of 79 slices shown]
[im 27/79  soft-tissue]
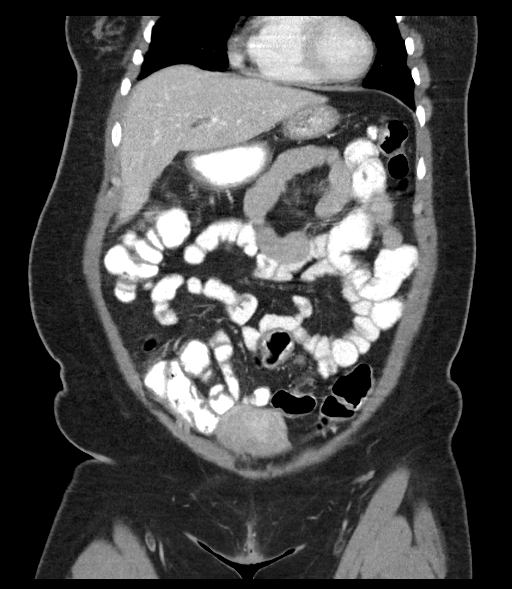
[im 35/79  soft-tissue]
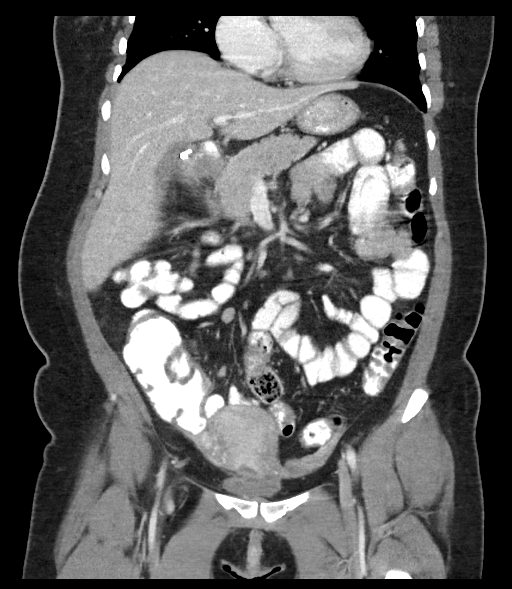
[im 44/79  soft-tissue]
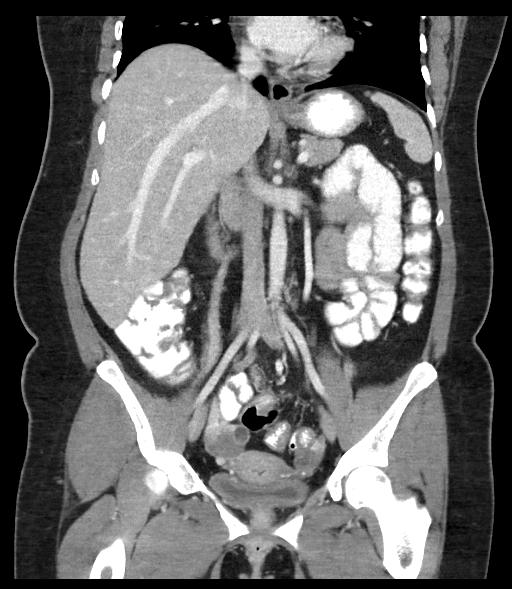

[16 of 46 positions shown; findings below may reference images not displayed]

FINDINGS: Lower chest: No acute abnormality

Hepatobiliary: The patient is status post cholecystectomy.
Ill-defined 2 x 3 cm area of fluid in the cholecystectomy bed
identified. This CBD measures 9 mm in diameter, previously 5 mm on
09/02/2016, and degree of dilatation somewhat unusual this soon
after cholecystectomy. Mild CBD dilatation extending to the ampulla
is noted without obstructing cause identified.

Probable hepatic steatosis noted.

Pancreas: Unremarkable

Spleen: Unremarkable

Adrenals/Urinary Tract: The kidneys, adrenal glands and bladder are
unremarkable.

Stomach/Bowel: Stomach is within normal limits. Appendix appears
normal. No evidence of bowel wall thickening, distention, or
inflammatory changes.

Vascular/Lymphatic: No significant vascular findings are present. No
enlarged abdominal or pelvic lymph nodes.

Reproductive: Uterus and bilateral adnexa are unremarkable.

Other: No ascites or pneumoperitoneum.

Musculoskeletal: No acute or significant osseous findings.
IMPRESSION: Status post cholecystectomy with ill-defined 2 x 3 cm area fluid in
the cholecystectomy bed, which may be postsurgical changes. No
evidence of ascites/free fluid.

Mild CBD dilatation to the ampulla without obstructing cause,
someone unexpected this soon after cholecystectomy. The CBD now
measures 9 mm in diameter, previously 5 mm on 09/02/2016 ultrasound.

## 2018-03-10 ENCOUNTER — Ambulatory Visit: Payer: Self-pay | Admitting: Family Medicine

## 2018-03-27 ENCOUNTER — Ambulatory Visit: Payer: Self-pay | Admitting: Family Medicine

## 2018-04-27 ENCOUNTER — Ambulatory Visit: Payer: Self-pay | Admitting: Family Medicine

## 2018-05-16 ENCOUNTER — Encounter: Payer: Self-pay | Admitting: Family Medicine

## 2018-05-16 ENCOUNTER — Ambulatory Visit: Payer: Self-pay | Attending: Family Medicine | Admitting: Family Medicine

## 2018-05-16 VITALS — BP 99/64 | HR 87 | Temp 99.2°F | Resp 18 | Ht 59.0 in | Wt 187.0 lb

## 2018-05-16 DIAGNOSIS — R112 Nausea with vomiting, unspecified: Secondary | ICD-10-CM

## 2018-05-16 DIAGNOSIS — K5900 Constipation, unspecified: Secondary | ICD-10-CM

## 2018-05-16 DIAGNOSIS — R1011 Right upper quadrant pain: Secondary | ICD-10-CM

## 2018-05-16 DIAGNOSIS — R1013 Epigastric pain: Secondary | ICD-10-CM

## 2018-05-16 MED ORDER — POLYETHYLENE GLYCOL 3350 17 GM/SCOOP PO POWD: 17.0000 g | Freq: Once | ORAL | 4 refills | Status: DC

## 2018-05-16 MED ORDER — OMEPRAZOLE 40 MG PO CPDR: 40.0000 mg | DELAYED_RELEASE_CAPSULE | Freq: Two times a day (BID) | ORAL | 3 refills | Status: DC

## 2018-05-16 MED ORDER — POLYETHYLENE GLYCOL 3350 17 GM/SCOOP PO POWD: 17.0000 g | Freq: Once | ORAL | 4 refills | Status: AC

## 2018-05-16 NOTE — Progress Notes (Signed)
Subjective:    Patient ID: [name], female    DOB: 06/06/1985, 33 y.o.   MRN: [id number]   Due to a language barrier, [employer] video interpretation system was used at today's visit though patient does speak English well but did need clarification for some issues which interpreter help with  HPI        33 yo female new to the practice.  Patient with complaint of having issues with recurrent upper abdominal discomfort both in the right upper abdomen and mid upper abdomen.  She reports that she has had her gallbladder removed in the fall 2018.  She however continues to have discomfort, dull, aching sensation in her right upper abdomen.  She also has episodes of recurrent nausea and vomiting.  She has noticed that the vomiting tends to occur with certain foods.  She has also had issues recently with onset of constipation.  She denies any blood in the stool and no dark/sticky stools.  She denies any current issues with urinary frequency, urgency or dysuria.  She is not currently taking any prescription medicine for acid reflux but has tried over-the-counter medicines which have helped at times with her nausea.  Patient's abdominal pain comes and goes and is not constant.  Abdominal pain will last for a few hours however especially in the right upper quadrant after spicy/greasy foods.  Patient also occasionally has some increased burping and belching.  Past Medical History:  Diagnosis Date  . Depression    hx - no meds   Past Surgical History:  Procedure Laterality Date  . CESAREAN SECTION   2004, 2010, 2013   x 3  . CHOLECYSTECTOMY N/A 09/03/2016   Procedure: LAPAROSCOPIC CHOLECYSTECTOMY;  Surgeon: Gaynelle AduWilson, Eric, MD;  [facility];  Service: General;  Laterality: N/A;   Social History   Tobacco Use  . Smoking status: Former Smoker    Types: Cigars    Last attempt to quit: 06/04/2010    Years since quitting: 7.9  . Smokeless tobacco: Never Used  Substance Use Topics  . Alcohol use:  Yes    Alcohol/week: 1.0 standard drinks    Types: 1 Cans of beer per week    Comment: socially  . Drug use: No   Family History  Problem Relation Age of Onset  . Hypertension Father   . Diabetes Sister    No Known Allergies    Review of Systems  Constitutional: Positive for fatigue. Negative for chills and fever.  HENT: Negative for sore throat and trouble swallowing.   Respiratory: Negative for cough and shortness of breath.   Cardiovascular: Negative for chest pain and leg swelling.  Gastrointestinal: Positive for abdominal pain, constipation, nausea and vomiting. Negative for blood in stool and diarrhea.  Genitourinary: Negative for dysuria, flank pain and frequency.  Musculoskeletal: Negative for arthralgias and back pain.  Neurological: Negative for dizziness and headaches.  Hematological: Negative for adenopathy. Does not bruise/bleed easily.       Objective:   Physical Exam Constitutional:      General: She is not in acute distress.    Appearance: Normal appearance. She is obese.  HENT:     Head: Normocephalic and atraumatic.     Right Ear: Tympanic membrane, ear canal and external ear normal.     Left Ear: Tympanic membrane, ear canal and external ear normal.     Nose: Nose normal. No rhinorrhea.     Mouth/Throat:     Mouth: Mucous membranes are moist.  Pharynx: Posterior oropharyngeal erythema (Mild posterior pharynx/tonsillar arch edema/erythema) present.  Eyes:     General: No scleral icterus.    Extraocular Movements: Extraocular movements intact.     Conjunctiva/sclera: Conjunctivae normal.  Neck:     Musculoskeletal: Normal range of motion and neck supple. No muscular tenderness.  Cardiovascular:     Rate and Rhythm: Normal rate and regular rhythm.  Pulmonary:     Effort: Pulmonary effort is normal.     Breath sounds: Normal breath sounds.  Abdominal:     Tenderness: There is abdominal tenderness (Mild epigastric and right upper quadrant  tenderness to palpation). There is no right CVA tenderness, left CVA tenderness, guarding or rebound.  Musculoskeletal:        General: No tenderness.     Right lower leg: No edema.     Left lower leg: No edema.  Lymphadenopathy:     Cervical: No cervical adenopathy.  Skin:    General: Skin is warm and dry.  Neurological:     General: No focal deficit present.     Mental Status: She is alert and oriented to person, place, and time.  Psychiatric:        Mood and Affect: Mood normal.        Behavior: Behavior normal.        Thought Content: Thought content normal.        Judgment: Judgment normal.    BP 99/64 (BP Location: Left Arm, Patient Position: Sitting, Cuff Size: Large)   Pulse 87   Temp 99.2 F (37.3 C) (Oral)   Resp 18   Ht  (1.499 m)   Wt 187 lb (84.8 kg)   LMP 05/05/2018   SpO2 97%   BMI 37.77 kg/m         Assessment & Plan:  1. Non-intractable vomiting with nausea, unspecified vomiting type Patient will be referred to gastroenterology for further evaluation and treatment.  Patient will have blood test for H. pylori to see if this bacteria may be contributing or causing her nausea and vomiting.  Patient will have CMP, lipase done at today's visit in follow-up of her nausea and vomiting.  Prescription provided for omeprazole 40 mg twice daily.  She should also avoid spicy/greasy foods, foods that she knows tend to trigger her nausea as well as avoidance of late night eating. - Comprehensive metabolic panel - Ambulatory referral to Gastroenterology - Lipase - H Pylori, IGM, IGG, IGA AB - omeprazole (PRILOSEC) 40 MG capsule; Take 1 capsule (40 mg total) by mouth 2 (two) times daily. To reduce stomach acid  Dispense: 30 capsule; Refill: 3  2. Right upper quadrant pain Patient with complaint of right upper quadrant pain.  She reports prior cholecystectomy.  Will refer to gastroenterology for further evaluation and treatment.  Check CMP for any abnormalities such  as elevated liver enzymes to see if she may require additional imaging to look for possible retained gallstones or fatty liver as the cause of her right upper quadrant pain. - Comprehensive metabolic panel - Ambulatory referral to Gastroenterology  3. Epigastric pain Patient will have lipase, CMP, H. pylori antibody testing and follow-up with gastroenterology regarding her abdominal pain.  She is also being placed on omeprazole 40 mg twice daily in the interim. - Comprehensive metabolic panel - Ambulatory referral to Gastroenterology - Lipase - H Pylori, IGM, IGG, IGA AB - omeprazole (PRILOSEC) 40 MG capsule; Take 1 capsule (40 mg total) by mouth 2 (two) times daily. To  reduce stomach acid  Dispense: 30 capsule; Refill: 3  4. Constipation, unspecified constipation type Prescription for MiraLAX to help with constipation.  Patient is encouraged to increase daily water intake as well as increased fresh fruits and vegetables to increase fiber in the diet.  She will also be referred to GI for further evaluation.  Patient's constipation may be contributing to her nausea.  Patient will also have TSH to look for thyroid disorder as a contributing factor to her constipation. - polyethylene glycol powder (GLYCOLAX/MIRALAX) powder; Take 17 g by mouth once for 1 dose. Mix with at least 8 ounces of fluid; take as needed for constipation  Dispense: 2300 g; Refill: 4 - TSH  An After Visit Summary was printed and given to the patient.  Allergies as of 05/16/2018   No Known Allergies     Medication List       Accurate as of May 16, 2018 11:59 PM. If you have any questions, ask your nurse or doctor.        STOP taking these medications   methocarbamol 500 MG tablet Commonly known as:  ROBAXIN Stopped by:  Cain Saupe, MD   oxyCODONE 5 MG immediate release tablet Commonly known as:  Oxy IR/ROXICODONE Stopped by:  Cain Saupe, MD     TAKE these medications   omeprazole 40 MG  capsule Commonly known as:  PRILOSEC Take 1 capsule (40 mg total) by mouth 2 (two) times daily. To reduce stomach acid Started by:  Cain Saupe, MD   ondansetron 4 MG disintegrating tablet Commonly known as:  ZOFRAN-ODT Take 1 tablet (4 mg total) by mouth every 6 (six) hours as needed for nausea.   polyethylene glycol powder 17 GM/SCOOP powder Commonly known as:  GLYCOLAX/MIRALAX Take 17 g by mouth once for 1 dose. Mix with at least 8 ounces of fluid; take as needed for constipation Started by:  Cain Saupe, MD   senna 8.6 MG Tabs tablet Commonly known as:  SENOKOT Take 1 tablet (8.6 mg total) by mouth 2 (two) times daily.       Return in about 2 weeks (around 05/30/2018) for abdominal pain/nausea.

## 2018-05-16 NOTE — Progress Notes (Signed)
Patient complains of epigastric pains with N/V for the past 3 days.

## 2018-05-17 ENCOUNTER — Encounter: Payer: Self-pay | Admitting: Internal Medicine

## 2018-05-17 MED FILL — OMEPRAZOLE DR 40 MG CAPSULE: 40 | 15 days supply | Qty: 30 | Fill #0

## 2018-05-17 MED FILL — POLYETHYLENE GLYCOL 3350 PO: 17 | 28 days supply | Qty: 476 | Fill #0

## 2018-05-18 LAB — COMPREHENSIVE METABOLIC PANEL WITH GFR
ALT: 17 IU/L (ref 0–32)
AST: 20 IU/L (ref 0–40)
Albumin/Globulin Ratio: 1.8 (ref 1.2–2.2)
Albumin: 4.6 g/dL (ref 3.8–4.8)
Alkaline Phosphatase: 82 IU/L (ref 39–117)
BUN/Creatinine Ratio: 19 (ref 9–23)
BUN: 10 mg/dL (ref 6–20)
Bilirubin Total: 0.2 mg/dL (ref 0.0–1.2)
CO2: 21 mmol/L (ref 20–29)
Calcium: 9.5 mg/dL (ref 8.7–10.2)
Chloride: 100 mmol/L (ref 96–106)
Creatinine, Ser: 0.52 mg/dL — ABNORMAL LOW (ref 0.57–1.00)
GFR calc Af Amer: 144 mL/min/1.73
GFR calc non Af Amer: 125 mL/min/1.73
Globulin, Total: 2.6 g/dL (ref 1.5–4.5)
Glucose: 122 mg/dL — ABNORMAL HIGH (ref 65–99)
Potassium: 4.1 mmol/L (ref 3.5–5.2)
Sodium: 139 mmol/L (ref 134–144)
Total Protein: 7.2 g/dL (ref 6.0–8.5)

## 2018-05-18 LAB — LIPASE: Lipase: 36 U/L (ref 14–72)

## 2018-05-18 LAB — TSH: TSH: 1.08 u[IU]/mL (ref 0.450–4.500)

## 2018-05-18 LAB — H PYLORI, IGM, IGG, IGA AB
H pylori, IgM Abs: <9 U (ref 0.0–8.9)
H. pylori, IgA Abs: <9 U (ref 0.0–8.9)
H. pylori, IgG AbS: 0.25 {index_val} (ref 0.00–0.79)

## 2018-05-19 ENCOUNTER — Telehealth: Payer: Self-pay | Admitting: *Deleted

## 2018-05-19 NOTE — Telephone Encounter (Signed)
-----   Message from Cain Saupe, MD sent at 05/17/2018  9:53 AM EST ----- Notify patient that the H. Pylori blood test is still pending. CMET normal but glucose of 122 which is normal if patient had recently eaten before the blood work was done. TSH and lipase were normal

## 2018-05-19 NOTE — Telephone Encounter (Signed)
Medical Assistant used [employer] to contact patient.  Interpreter Name: Marge Duncans Interpreter #: [phone] Patient was not available, [employer] left patient a voicemail. Voicemail states to give a call back to [place] with [facility] at [phone].

## 2018-05-22 ENCOUNTER — Telehealth: Payer: Self-pay | Admitting: General Practice

## 2018-05-22 NOTE — Telephone Encounter (Signed)
Patient returned call regarding lab results. Please f/u  

## 2018-05-23 NOTE — Telephone Encounter (Signed)
Medical Assistant used [employer] to contact patient.  Interpreter Name: Trixie Deis #: [id number] Patient verified DOB Patient is aware of h pylori being negative. Patient is aware of labs being normal. Patient will follow up as planned.

## 2018-05-25 ENCOUNTER — Ambulatory Visit: Payer: Self-pay | Admitting: Internal Medicine

## 2018-06-19 ENCOUNTER — Ambulatory Visit: Payer: Self-pay | Admitting: Family Medicine

## 2018-07-21 ENCOUNTER — Telehealth: Payer: Self-pay | Admitting: *Deleted

## 2018-07-21 ENCOUNTER — Ambulatory Visit: Payer: Self-pay | Admitting: Family Medicine

## 2018-07-21 NOTE — Telephone Encounter (Signed)
Called [employer] and spoke with Beth ID number [phone] who called the patient to verify if she is still coming to her appointment. Staff asked if patient she was still coming to appointment today for a in person appointment. Per pt, with all of this going on with the covid 19, she is afraid to go to any doctor office right now and would like to resch her appointment. Provider was informed and call was transferred to the front staff for patient to resch her appointment.

## 2018-08-02 ENCOUNTER — Ambulatory Visit: Payer: Self-pay | Attending: Family Medicine | Admitting: Family Medicine

## 2018-08-02 ENCOUNTER — Encounter: Payer: Self-pay | Admitting: Family Medicine

## 2018-08-02 ENCOUNTER — Other Ambulatory Visit: Payer: Self-pay

## 2018-08-02 VITALS — BP 114/73 | HR 80 | Temp 98.6°F | Ht 59.0 in | Wt 190.8 lb

## 2018-08-02 DIAGNOSIS — R739 Hyperglycemia, unspecified: Secondary | ICD-10-CM

## 2018-08-02 DIAGNOSIS — R1013 Epigastric pain: Secondary | ICD-10-CM

## 2018-08-02 DIAGNOSIS — Z9049 Acquired absence of other specified parts of digestive tract: Secondary | ICD-10-CM

## 2018-08-02 DIAGNOSIS — R21 Rash and other nonspecific skin eruption: Secondary | ICD-10-CM

## 2018-08-02 DIAGNOSIS — R1011 Right upper quadrant pain: Secondary | ICD-10-CM

## 2018-08-02 LAB — POCT GLYCOSYLATED HEMOGLOBIN (HGB A1C): Hemoglobin A1C: 5.7 % — AB (ref 4.0–5.6)

## 2018-08-02 MED ORDER — FAMOTIDINE 20 MG PO TABS: 20.0000 mg | ORAL_TABLET | Freq: Two times a day (BID) | ORAL | 3 refills | Status: DC

## 2018-08-02 MED ORDER — TRIAMCINOLONE ACETONIDE 0.1 % EX CREA: 1.0000 "application " | TOPICAL_CREAM | Freq: Two times a day (BID) | CUTANEOUS | 3 refills | Status: DC

## 2018-08-02 MED FILL — TRIAMCINOLONE ACETONIDE 0.1: 0.1 | 14 days supply | Qty: 30 | Fill #0

## 2018-08-02 NOTE — Progress Notes (Signed)
Established Patient Office Visit  Subjective:  Patient ID: [name], female    DOB: 12/18/1985  Age: 33 y.o. MRN: [id number]   Due to a language barrier, [employer] video interpretation system was used at today's visit  CC: follow-up of right upper quadrant and epigastric pain Chief Complaint  Patient presents with  . Follow-up     HPI [name] presents for follow-up of epigastric and right upper quadrant pain and new complaint of a rash which started on her left hand beneath a ring that she was wearing so she removed the ring but the rash continued to spread down her finger onto her hand and now has an area of rash on her right hand as well. Skin is dry and itchy and she has tried otc creams without success. 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 Rash has been present for about 3 weeks.      She reports that her mid-upper abdominal pain and nausea are improved. She had to stop the use of omeprazole due to onset of an generalized itchy rash especially on her upper chest that went away after she stopped the medication. She still continues to have occasional right upper pain that is at times sharp and very uncomfortable, about a 6-7 on a 0-10 pain scale.  She has not seen any relation between eating and the onset of her pain. She does at times have issues with loose stools but no light colored stools or black stools. No loss of appetite. No fever or chills. Patient recalls that when she had her gallbladder removed she was told that the operation took longer than expected. Patient states that was never contacted by GI after her last visit here.        She has a family history of sister with diabetes but has not had any issues with increased thirst, urinary frequency or blurred vision. Patient is not sure if she had eaten before she had blood work done here in February.   Past Medical History:  Diagnosis Date  .  Depression    hx - no meds    Past Surgical History:  Procedure Laterality Date  . CESAREAN SECTION   2004, 2010, 2013   x 3  . CHOLECYSTECTOMY N/A 09/03/2016   Procedure: LAPAROSCOPIC CHOLECYSTECTOMY;  Surgeon: Gaynelle AduWilson, Eric, MD;  [facility];  Service: General;  Laterality: N/A;    Family History  Problem Relation Age of Onset  . Hypertension Father   . Diabetes Sister    Social History   Tobacco Use  . Smoking status: Former Smoker    Types: Cigars    Last attempt to quit: 06/04/2010    Years since quitting: 8.1  . Smokeless tobacco: Never Used  Substance Use Topics  . Alcohol use: Yes    Alcohol/week: 1.0 standard drinks    Types: 1 Cans of beer per week    Comment: socially  . Drug use: No   No Known Allergies  ROS Review of Systems  Constitutional: Positive for fatigue. Negative for chills and fever.  Eyes: Negative for photophobia and visual disturbance.  Respiratory: Negative for cough and shortness of breath.   Cardiovascular: Negative for chest pain and palpitations.  Gastrointestinal: Positive for abdominal pain and diarrhea (loose stools). Negative for blood in stool, constipation, nausea and vomiting.  Endocrine: Negative for polydipsia, polyphagia and polyuria.  Genitourinary: Negative for dysuria and frequency.  Musculoskeletal: Negative for arthralgias and back pain.  Skin: Positive for rash. Negative for wound.  Neurological: Negative for dizziness and headaches.  Hematological: Negative for adenopathy. Does not bruise/bleed easily.      Objective:    Physical Exam  Constitutional: She is oriented to person, place, and time. She appears well-developed and well-nourished.  Overweight for height/obese female  Eyes: Conjunctivae and EOM are normal. No scleral icterus.  Neck: Normal range of motion. Neck supple. No thyromegaly present.  Cardiovascular: Normal rate and regular rhythm.  Pulmonary/Chest: Effort normal and breath sounds normal.    Abdominal: Soft. There is abdominal tenderness. There is guarding (mild, voluntary). There is no rebound.  Truncal obesity; tender in the epigastric and right upper quadrant area with area of greatest tenderness in the right upper quadrant with some voluntary guarding  Musculoskeletal:        General: No tenderness or edema.  Lymphadenopathy:    She has no cervical adenopathy.  Neurological: She is alert and oriented to person, place, and time.  Skin: Skin is warm and dry. Rash noted.  Patient with salmon colored plaque of dry skin on the dorsum of the left hand from the PIP joint to just below the MTP joint and one dime sized similar area on the dorsum of the right hand over the middle knuckle/MTP joint  Psychiatric: She has a normal mood and affect. Her behavior is normal. Judgment and thought content normal.  Nursing note and vitals reviewed.   BP 114/73 (BP Location: Right Arm, Patient Position: Sitting, Cuff Size: Large)   Pulse 80   Temp 98.6 F (37 C) (Oral)   Ht  (1.499 m)   Wt 190 lb 12.8 oz (86.5 kg)   SpO2 97%   BMI 38.54 kg/m  Wt Readings from Last 3 Encounters:  08/02/18 190 lb 12.8 oz (86.5 kg)  05/16/18 187 lb (84.8 kg)  09/03/16 178 lb 3.2 oz (80.8 kg)     Health Maintenance Due  Topic Date Due  . TETANUS/TDAP  11/07/2002  . PAP SMEAR-Modifier  11/06/2004      Lab Results  Component Value Date   TSH 1.080 05/16/2018   Lab Results  Component Value Date   WBC 9.5 09/05/2016   HGB 9.3 (L) 09/05/2016   HCT 28.8 (L) 09/05/2016   MCV 88.6 09/05/2016   PLT 382 09/05/2016   Lab Results  Component Value Date   NA 139 05/16/2018   K 4.1 05/16/2018   CO2 21 05/16/2018   GLUCOSE 122 (H) 05/16/2018   BUN 10 05/16/2018   CREATININE 0.52 (L) 05/16/2018   BILITOT 0.2 05/16/2018   ALKPHOS 82 05/16/2018   AST 20 05/16/2018   ALT 17 05/16/2018   PROT 7.2 05/16/2018   ALBUMIN 4.6 05/16/2018   CALCIUM 9.5 05/16/2018   ANIONGAP 6 09/05/2016   No  results found for: CHOL No results found for: HDL No results found for: LDLCALC No results found for: TRIG No results found for: CHOLHDL No results found for: ZOXW9U    Assessment & Plan:  1. Right upper quadrant pain Patient with continued right upper quadrant pain despite cholecystectomy on 09/03/16 after presenting to the ED due to right upper quadrant pain and patient with US showing a 2.9 cm gallstone lodged in the neck of the gallbladder with edematous gallbladder wall. Patient had continued right upper quadrant pain along with nausea and weight loss and had CT of the abdomen on 09/29/16 showing 9 mm CBD  dilatation. Patient will be referred again to GI for further evaluation and treatment of her continued right upper quadrant pain. Will recheck LFT's at today's visit. - Hepatic Function Panel - Ambulatory referral to Gastroenterology  2. Epigastric pain Patient will be referred to GI for further evaluation and treatment. Patient had negative antibody testing for H. Pylori and normal lipase back in  February. Patient reports having onset of an itchy rash with use of omeprazole which then she then discontinued. New RX provided for pepcid. - Ambulatory referral to Gastroenterology - famotidine (PEPCID) 20 MG tablet; Take 1 tablet (20 mg total) by mouth 2 (two) times daily. One pill twice per day to reduce stomach acid  Dispense: 180 tablet; Refill: 3  3. Elevated blood sugar Patient with elevated blood sugar of 122 on blood work done in Feb and will check A1c at today's visit. A1c was just above normal at 5.7 consistent with an increased future risk of patient becoming diabetic. Will discuss with patient further at her upcoming visit.  - HgB A1c  4. Rash Appears to be a form of contact dermatitis or atopic dermatitis. Patient has used otc creams including lamisil antifungal cream on the area of the rash with no improvement. Will prescribe triamcinolone and have patient also use an adhesive  wrap/saran wrap over the area at night during sleep and also keep her hands while moisturized.  - triamcinolone cream (KENALOG) 0.1 %; Apply 1 application topically 2 (two) times daily. X 10 days for rash then use as needed  Dispense: 30 g; Refill: 3  5. S/P cholecystectomy Patient is s/p removal of the gallbladder in 2018 secondary to the presence of gallstones but had CT scan done afterwards on 09/29/2016 showed mild CBD dilatation to the ampulla without obstructing cause and CBD measurement of 9 mm and previously 5 mm. Patient is being referred for GI due to continued right upper quadrant pain s/p removal of the gallbladder. - Ambulatory referral to Gastroenterology     Follow-up: Return in about 8 weeks (around 09/27/2018) for abd pain-call back if you do not hear from GI within 1 week.   Cain Saupe, MD

## 2018-08-02 NOTE — Progress Notes (Signed)
Patient is here for 2 wks f/u for her abd pain  Per patient she is still having a bit right abd pain.  Per pt she stopped taking her Omeprazole due to itching all the time when she takes it.

## 2018-08-03 LAB — HEPATIC FUNCTION PANEL
ALT: 24 IU/L (ref 0–32)
AST: 21 IU/L (ref 0–40)
Albumin: 5 g/dL — ABNORMAL HIGH (ref 3.8–4.8)
Alkaline Phosphatase: 85 IU/L (ref 39–117)
Bilirubin Total: <0.2 mg/dL (ref 0.0–1.2)
Bilirubin, Direct: 0.07 mg/dL (ref 0.00–0.40)
Total Protein: 7.4 g/dL (ref 6.0–8.5)

## 2018-08-07 ENCOUNTER — Ambulatory Visit (INDEPENDENT_AMBULATORY_CARE_PROVIDER_SITE_OTHER): Payer: Self-pay | Admitting: Gastroenterology

## 2018-08-07 ENCOUNTER — Other Ambulatory Visit: Payer: Self-pay

## 2018-08-07 ENCOUNTER — Encounter: Payer: Self-pay | Admitting: Gastroenterology

## 2018-08-07 VITALS — Ht 59.0 in | Wt 190.0 lb

## 2018-08-07 DIAGNOSIS — R1013 Epigastric pain: Secondary | ICD-10-CM

## 2018-08-07 DIAGNOSIS — R1011 Right upper quadrant pain: Secondary | ICD-10-CM

## 2018-08-07 DIAGNOSIS — G8929 Other chronic pain: Secondary | ICD-10-CM

## 2018-08-07 MED ORDER — PANTOPRAZOLE SODIUM 40 MG PO TBEC: 40.0000 mg | DELAYED_RELEASE_TABLET | Freq: Every day | ORAL | 2 refills | Status: DC

## 2018-08-07 NOTE — Patient Instructions (Addendum)
Start pantoprazole 40 mg daily - taken 30-60 minutes before breakfast.  Plan CT of the abd/pelvis with contrast to follow-up on the pain.  If the CT scan is negative, will proceed with upper endoscopy for further evaluation.   Thank you for your patience with me and our technology today! Please stay home, safe, and healthy. I look forward to meeting you in person in the future.

## 2018-08-07 NOTE — Progress Notes (Signed)
TELEHEALTH VISIT  Referring Provider: Antony Blackbird, MD Primary Care Physician:  [name], No Pcp Per   Tele-visit due to COVID-19 pandemic [name] requested visit virtually, consented to the virtual encounter via audio enabled telemedicine application Contact made at: 11:00 am 08/07/18 [name] verified by name and date of birth Location of [name]: Home Location provider: [facility] Names of persons participating: Me, [name], Debbe Mounts CMA, interpreter through [employer]  Time spent on telehealth visit: 28 minutes I discussed the limitations of evaluation and management by telemedicine. The [name] expressed understanding and agreed to proceed.  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 Less likely malignancy.    PLAN: Pantoprazole 40 mg daily - taken 30-60 minutes before eating CT of the abdomen and pelvis with contrast for abdominal pain Plan EGD if CT is negative  I consented the [name] discussing the risks, benefits, and alternatives to endoscopic evaluation. In particular, we discussed the risks that include, but are not limited to, reaction to medication, cardiopulmonary compromise, bleeding requiring blood transfusion, aspiration resulting in pneumonia, perforation requiring surgery, lack of diagnosis, severe illness requiring hospitalization, and even death. We reviewed the risk of missed lesion including polyps or even cancer. The [name] acknowledges these risks and asks that we proceed.  HPI: Kynzie Polgar is a 33 y.o.  female referred by Dr. Chapman Fitch for evaluation of right upper quadrant and epigastric pain.  History is obtained through the [name] and review of her electronic health record with the assistance of a [place] interpreter.  Prior cholecystectomy 09/03/2016  Intermittent sharp right upper quadrant pain and epigastric pain described as a 6-7 out of 10. Initially started in her stomach and now moved to the RUQ quadrant. Similar to abdominal pain prior to cholecystectomy, but not as severe.  May may have improved initially after her cholecystectomy but symptoms quickly returned. Associated nausea.   Unable to eat sauces or spicy foods due to symptoms, otherwise, not associated with eating.  Improves with a deep breath or relaxation.  No association with position, or defecation.  Good appetite.  Stable weight.  No diarrhea, constipation, or change in bowel habits.  No melena, hematochezia or bright red blood per rectum.  Did not tolerate omeprazole to associated generalized pruritic rash that went away when she stopped the medication. Started famotidine but was unable to get it from the pharmacy. Has not tried any other medications. No NSAIDs. No other associated symptoms. No identified exacerbating or relieving features.   Labs from 05/16/2018 showing negative H. pylori IgM and IgG.  Liver enzymes were normal including AST 20, ALT 17, alk phos 82, total bilirubin 0.2.  Lipase was 36.  Glucose was 122.  TSH was 1.080.  Abdominal ultrasound 09/02/16 for right upper quadrant pain showed acute cholecystitis with a 2.9 cm gallstone lodged in the neck of the gallbladder.  The [name] subsequently had a cholecystectomy 09/03/2016.  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  The common bile duct measures 9 mm.  No known family history of colon cancer or polyps. No family history of uterine/endometrial cancer, pancreatic cancer or gastric/stomach cancer.  Past Medical History:  Diagnosis Date  . Depression    hx - no meds    Past Surgical History:  Procedure Laterality Date  . CESAREAN SECTION   2004, 2010, 2013   x 3  . CHOLECYSTECTOMY N/A 09/03/2016   Procedure: LAPAROSCOPIC CHOLECYSTECTOMY;  Surgeon: Greer Pickerel, MD;  Location: [facility];  Service: General;  Laterality: N/A;    Current Outpatient Medications  Medication Sig Dispense Refill  . famotidine (PEPCID) 20 MG tablet Take 1 tablet (20 mg total) by mouth 2 (two) times daily. One pill twice per day to reduce stomach acid 180 tablet 3  . ondansetron (ZOFRAN-ODT) 4 MG disintegrating tablet Take 1 tablet (4 mg total) by mouth every 6 (six) hours as needed for nausea. ([name] not taking: Reported on 05/16/2018) 20 tablet 0  . senna (SENOKOT) 8.6 MG TABS tablet Take 1 tablet (8.6 mg total) by mouth 2 (two) times daily. 120 each 0  . triamcinolone cream (KENALOG) 0.1 % Apply 1 application topically 2 (two) times daily. X 10 days for rash then use as needed 30 g 3   No current facility-administered medications for this visit.     Allergies as of 08/07/2018 - Review Complete 08/07/2018  Allergen Reaction Noted  . Omeprazole Rash 08/02/2018    Family History  Problem Relation Age of Onset  . Hypertension Father   . Diabetes Sister     Social History   Socioeconomic History  . Marital status: Single    Spouse name: Not on file  . Number of children: Not on file  . Years of education: Not on file  . Highest education level: Not on file  Occupational History  . Not on file  Social Needs  . Financial resource strain: Not on file  . Food insecurity:    Worry: Not on file    Inability: Not on file  . Transportation needs:    Medical: Not on file     Non-medical: Not on file  Tobacco Use  . Smoking status: Former Smoker    Types: Cigars    Last attempt to quit: 06/04/2010    Years since quitting: 8.1  . Smokeless tobacco: Never Used  Substance and Sexual Activity  . Alcohol use: Yes    Alcohol/week: 1.0 standard drinks    Types: 1 Cans of beer per week    Comment: socially  . Drug use: No  . Sexual activity: Yes    Birth control/protection: None    Comment: pregnant  Lifestyle  . Physical activity:    Days per week: Not on file    Minutes per session: Not on file  . Stress: Not on file  Relationships  . Social connections:    Talks on phone: Not on file    Gets together: Not on file    Attends religious service: Not on file    Active member of club or organization: Not on file    Attends meetings of clubs or organizations: Not on file    Relationship status: Not on file  . Intimate partner violence:    Fear of current or ex partner: Not on file    Emotionally abused: Not on file    Physically abused: Not  on file    Forced sexual activity: Not on file  Other Topics Concern  . Not on file  Social History Narrative  . Not on file    Review of Systems: ALL ROS discussed and all others negative except listed in HPI.  Physical Exam: General: in no acute distress Neuro: Alert and appropriate Psych: Normal affect and normal insight   Milus Fritze L. Tarri Glenn, MD, MPH [facility] 08/07/2018, 10:53 AM

## 2018-08-22 ENCOUNTER — Other Ambulatory Visit: Payer: Self-pay

## 2018-09-04 ENCOUNTER — Encounter: Payer: Self-pay | Admitting: Family Medicine

## 2018-09-19 ENCOUNTER — Telehealth: Payer: Self-pay | Admitting: General Practice

## 2018-09-19 ENCOUNTER — Ambulatory Visit (HOSPITAL_COMMUNITY)
Admission: EM | Admit: 2018-09-19 | Discharge: 2018-09-19 | Disposition: A | Discharge status: Home / Self Care | Payer: Self-pay | Attending: Emergency Medicine | Admitting: Emergency Medicine

## 2018-09-19 ENCOUNTER — Encounter (HOSPITAL_COMMUNITY): Payer: Self-pay

## 2018-09-19 ENCOUNTER — Other Ambulatory Visit: Payer: Self-pay

## 2018-09-19 DIAGNOSIS — L509 Urticaria, unspecified: Secondary | ICD-10-CM

## 2018-09-19 DIAGNOSIS — T7840XA Allergy, unspecified, initial encounter: Secondary | ICD-10-CM

## 2018-09-19 MED ORDER — FAMOTIDINE 20 MG PO TABS: 20.0000 mg | ORAL_TABLET | Freq: Two times a day (BID) | ORAL | 0 refills | Status: DC

## 2018-09-19 MED ORDER — PREDNISONE 20 MG PO TABS
ORAL_TABLET | ORAL | Status: AC
  Filled 2018-09-19: qty 3

## 2018-09-19 MED ORDER — EPINEPHRINE 0.3 MG/0.3ML IJ SOAJ: 0.3000 mg | Freq: Once | INTRAMUSCULAR | 1 refills | Status: AC

## 2018-09-19 MED ORDER — FAMOTIDINE 20 MG PO TABS
40.0000 mg | ORAL_TABLET | Freq: Once | ORAL | Status: AC
  Administered 2018-09-19: 40 mg via ORAL

## 2018-09-19 MED ORDER — DIPHENHYDRAMINE HCL 25 MG PO CAPS
50.0000 mg | ORAL_CAPSULE | Freq: Once | ORAL | Status: AC
  Administered 2018-09-19: 21:00:00 50 mg via ORAL

## 2018-09-19 MED ORDER — FAMOTIDINE 20 MG PO TABS
ORAL_TABLET | ORAL | Status: AC
  Filled 2018-09-19: qty 2

## 2018-09-19 MED ORDER — LORATADINE 10 MG PO TABS: 10.0000 mg | ORAL_TABLET | Freq: Every day | ORAL | 0 refills | Status: AC

## 2018-09-19 MED ORDER — DIPHENHYDRAMINE HCL 25 MG PO CAPS
ORAL_CAPSULE | ORAL | Status: AC
  Filled 2018-09-19: qty 2

## 2018-09-19 MED ORDER — PREDNISONE 10 MG (21) PO TBPK: ORAL_TABLET | ORAL | 0 refills | Status: DC

## 2018-09-19 MED ORDER — PREDNISONE 20 MG PO TABS
60.0000 mg | ORAL_TABLET | Freq: Once | ORAL | Status: AC
  Administered 2018-09-19: 60 mg via ORAL

## 2018-09-19 NOTE — Discharge Instructions (Addendum)
Finish the prednisone, Pepcid, Claritin.  Use the EpiPen for any signs of severe allergic reaction.  You may take Benadryl at night.  Call your gastroenterologist to discuss what other proton pump inhibitors that you can take.

## 2018-09-19 NOTE — Telephone Encounter (Signed)
Please find out what kind of allergic reaction she had with the use of Omeprazole. Please document in her allergy profile. Also, clarify medication name as she was recently seen by GI and prescribed pantoprazole so make so that it is not the new medication that she had a reaction to and not the omeprazole.  Please also have her contact GI regarding a change in medication. You can look at the GI note in her chart to give her the number

## 2018-09-19 NOTE — Telephone Encounter (Signed)
Patient called stating she is having rashes and an allergic reaction to the omeprazole. Patient states it has happened three times. Please follow up.

## 2018-09-19 NOTE — Telephone Encounter (Signed)
Please document what symptoms patient had that she considers an allergic reaction to omeprazole-did she have a rash, throat/mouth or lip swelling?

## 2018-09-19 NOTE — ED Triage Notes (Signed)
Pt states she has a allergy reaction to her omeprazole. Pt states she has a rash all over her body. This started yesterday

## 2018-09-19 NOTE — ED Provider Notes (Signed)
HPI  SUBJECTIVE:  [name] is a 33 y.o. female who presents with migratory, itchy, red raised rash over her face, torso, extremities starting this morning.  She has been on omeprazole for 3 days for peptic ulcer disease/abdominal pain.  She states that she has had an identical reaction to omeprazole in the past, but the omeprazole helps her abdominal pain.  She tried Benadryl cream, milk and lemon juice without improvement in her symptoms.  No aggravating or alleviating factors.  She reports some diarrhea.  No lip swelling, tongue swelling, sensation of throat swelling shut.  No wheezing, difficulty breathing, shortness of breath, chest pain, syncope.  No new lotions, soaps, detergents, foods.  She does not take any other medications.  past medical history of peptic ulcer disease, status post cholecystectomy.  No history of anaphylaxis, allergies.  LMP: 5/4.  Denies the possibility being pregnant.  [facility]  Patient had an e visit in May for her abdominal pain, the plan was to start her on pantoprazole due to an intolerance to the omeprazole, but the patient states that she did not know that she received a prescription for the pantoprazole and never filled it.   Past Medical History:  Diagnosis Date  . Depression    hx - no meds    Past Surgical History:  Procedure Laterality Date  . CESAREAN SECTION   2004, 2010, 2013   x 3  . CHOLECYSTECTOMY N/A 09/03/2016   Procedure: LAPAROSCOPIC CHOLECYSTECTOMY;  Surgeon: Greer Pickerel, MD;  Location: [facility];  Service: General;  Laterality: N/A;    Family History  Problem Relation Age of Onset  . Hypertension Father   . Diabetes Sister     Social History   Tobacco Use  . Smoking status: Former Smoker    Types: Cigars    Quit date: 06/04/2010    Years since quitting: 8.2  . Smokeless tobacco: Never Used  Substance Use Topics  . Alcohol use: Yes    Alcohol/week: 1.0 standard drinks    Types: 1 Cans of beer per  week    Comment: socially  . Drug use: No    No current facility-administered medications for this encounter.   Current Outpatient Medications:  .  EPINEPHrine 0.3 mg/0.3 mL IJ SOAJ injection, Inject 0.3 mLs (0.3 mg total) into the muscle once for 1 dose., Disp: 1 Device, Rfl: 1 .  famotidine (PEPCID) 20 MG tablet, Take 1 tablet (20 mg total) by mouth 2 (two) times daily., Disp: 10 tablet, Rfl: 0 .  loratadine (CLARITIN) 10 MG tablet, Take 1 tablet (10 mg total) by mouth daily., Disp: 10 tablet, Rfl: 0 .  ondansetron (ZOFRAN-ODT) 4 MG disintegrating tablet, Take 1 tablet (4 mg total) by mouth every 6 (six) hours as needed for nausea. (Patient not taking: Reported on 05/16/2018), Disp: 20 tablet, Rfl: 0 .  predniSONE (STERAPRED UNI-PAK 21 TAB) 10 MG (21) TBPK tablet, Dispense one 6 day pack. Take as directed with food., Disp: 21 tablet, Rfl: 0 .  senna (SENOKOT) 8.6 MG TABS tablet, Take 1 tablet (8.6 mg total) by mouth 2 (two) times daily., Disp: 120 each, Rfl: 0 .  triamcinolone cream (KENALOG) 0.1 %, Apply 1 application topically 2 (two) times daily. X 10 days for rash then use as needed, Disp: 30 g, Rfl: 3  Allergies  Allergen Reactions  . Omeprazole Rash     ROS  As noted in HPI.   Physical Exam  BP 130/72 (BP Location:  Right Arm)   Pulse 67   Temp 98.3 F (36.8 C) (Oral)   Resp 18   Wt 86.2 kg   LMP 08/27/2018   SpO2 97%   BMI 38.38 kg/m   Constitutional: Well developed, well nourished, no acute distress Eyes: PERRL, EOMI, conjunctiva normal bilaterally HENT: Normocephalic, atraumatic,mucus membranes moist.  No angioedema of the lips or tongue.  Airway widely patent. Respiratory: Clear to auscultation bilaterally, no rales, no wheezing, no rhonchi Cardiovascular: Normal rate and rhythm, no murmurs, no gallops, no rubs GI: Distended, active bowel sounds. Skin: blanchable urticaria of her back, arms, legs. see pictures         Musculoskeletal: No edema, no  tenderness, no deformities Neurologic: Alert & oriented x 3, CN II-XII grossly intact, no motor deficits, sensation grossly intact Psychiatric: Speech and behavior appropriate   ED Course   Medications  predniSONE (DELTASONE) tablet 60 mg (60 mg Oral Given 09/19/18 2036)  famotidine (PEPCID) tablet 40 mg (40 mg Oral Given 09/19/18 2037)  diphenhydrAMINE (BENADRYL) capsule 50 mg (50 mg Oral Given 09/19/18 2037)  famotidine (PEPCID) 20 MG tablet (has no administration in time range)  diphenhydrAMINE (BENADRYL) 25 mg capsule (has no administration in time range)  predniSONE (DELTASONE) 20 MG tablet (has no administration in time range)    No orders of the defined types were placed in this encounter.  No results found for this or any previous visit (from the past 24 hour(s)). No results found.  ED Clinical Impression  Allergic reaction, initial encounter  Urticaria   ED Assessment/Plan   Patient with urticaria from the omeprazole.  No evidence of anaphylaxis.  Airway is widely patent.  Gave 60 mg of prednisone, 40 mg of Pepcid, 50 mg of Benadryl.  On reevaluation, patient states that she is starting to feel better.  Airway still widely patent.  Home with Pepcid, Claritin, 6-day prednisone taper, EpiPen.  Advised her that she will need to call her gastroenterologist to discuss other proton pump inhibitor options.  I do not feel comfortable prescribing a different PPI at this time.  Discussed  MDM, treatment plan, and plan for follow-up with patient Discussed sn/sx that should prompt return to the ED. patient agrees with plan.   Meds ordered this encounter  Medications  . predniSONE (DELTASONE) tablet 60 mg  . famotidine (PEPCID) tablet 40 mg  . diphenhydrAMINE (BENADRYL) capsule 50 mg  . DISCONTD: famotidine (PEPCID) 20 MG tablet    Sig: Take 1 tablet (20 mg total) by mouth 2 (two) times daily.    Dispense:  10 tablet    Refill:  0  . loratadine (CLARITIN) 10 MG tablet    Sig:  Take 1 tablet (10 mg total) by mouth daily.    Dispense:  10 tablet    Refill:  0  . predniSONE (STERAPRED UNI-PAK 21 TAB) 10 MG (21) TBPK tablet    Sig: Dispense one 6 day pack. Take as directed with food.    Dispense:  21 tablet    Refill:  0  . EPINEPHrine 0.3 mg/0.3 mL IJ SOAJ injection    Sig: Inject 0.3 mLs (0.3 mg total) into the muscle once for 1 dose.    Dispense:  1 Device    Refill:  1  . famotidine (PEPCID) 20 MG tablet    Sig: Take 1 tablet (20 mg total) by mouth 2 (two) times daily.    Dispense:  10 tablet    Refill:  0    *  This clinic note was created using Scientist, clinical (histocompatibility and immunogenetics)Dragon dictation software. Therefore, there may be occasional mistakes despite careful proofreading.  ?   Domenick GongMortenson, Jenell Dobransky, MD 09/19/18 2101

## 2018-09-19 NOTE — Telephone Encounter (Signed)
Patients call taken.  Patient identified by name and date of birth.  Patient states she has had three diferent allergic reactions to omeprazole.  Patient does gets relief using benadryl cream.    Patient advised to get benadryl pills and take 25 mg at a time every 6 hours.  If she has no relief she is to call back for further advise.  Patient would also like a different medication for her stomach that didn't give her an allergic reaction.   Patient was told that if his condition worsened and/or change for the worse to go to the Emergency Department or Urgent care.  Patient acknowledged understanding of advice.

## 2018-09-19 NOTE — Telephone Encounter (Signed)
Patients call returned.  Patient identified by name and date of birth.  Patient advised to call Gastroenterology to find out which medication she should be taking and what medications she needs or her allergic reaction.  Patient acknowledged understanding of advice.

## 2018-09-21 NOTE — Telephone Encounter (Signed)
If her allergic reaction was due to the omeprazole, has patient started the pantoprazole recently prescribed by gastroenterology

## 2018-09-21 NOTE — Telephone Encounter (Signed)
Patients call returned.  Patient identified by name and date of birth.  Patient was unable to get through to Gastroenterology.Patient denies throat or lip swelling, shortness of breath,  Patient endorses rash.  Patient when to Urgent Care and got solumedrol, and benadryl which worked for a while but did not completely took the reaction away.  Patient made an appointment.  Patient acknowledged understanding of advice.

## 2018-09-22 ENCOUNTER — Encounter: Payer: Self-pay | Admitting: Family Medicine

## 2018-09-22 ENCOUNTER — Encounter: Payer: Self-pay | Admitting: *Deleted

## 2018-09-22 ENCOUNTER — Ambulatory Visit: Payer: Self-pay | Attending: Family Medicine | Admitting: Family Medicine

## 2018-09-22 ENCOUNTER — Other Ambulatory Visit: Payer: Self-pay

## 2018-09-22 VITALS — BP 119/76 | HR 87 | Temp 98.8°F | Ht 59.0 in | Wt 189.0 lb

## 2018-09-22 DIAGNOSIS — K219 Gastro-esophageal reflux disease without esophagitis: Secondary | ICD-10-CM

## 2018-09-22 DIAGNOSIS — R110 Nausea: Secondary | ICD-10-CM

## 2018-09-22 MED ORDER — FAMOTIDINE 20 MG PO TABS: 20.0000 mg | ORAL_TABLET | Freq: Two times a day (BID) | ORAL | 3 refills | Status: DC

## 2018-09-22 MED FILL — TRIAMCINOLONE ACETONIDE 0.1: 0.1 | 14 days supply | Qty: 30 | Fill #1

## 2018-09-22 NOTE — Progress Notes (Signed)
Work letter stating she was seen in our office today

## 2018-09-22 NOTE — Progress Notes (Signed)
Hospital due to allergic reaction to Omeprazole now she's good,  Per pt right now she good and but had to go the urgent care 2 days ago due to itching

## 2018-09-22 NOTE — Patient Instructions (Signed)
Opciones de alimentos para pacientes adultos con enfermedad de reflujo gastroesofgico Food Choices for Gastroesophageal Reflux Disease, Adult Si tiene enfermedad de reflujo gastroesofgico (ERGE), los alimentos que consume y los hbitos de alimentacin son muy importantes. Elegir los alimentos adecuados puede ayudar a aliviar las molestias. Piense en consultar a un especialista en nutricin (nutricionista) para que lo ayude a hacer buenas elecciones. Consejos para seguir este plan  Comidas  Elija alimentos saludables con bajo contenido de grasa, como frutas, verduras, cereales integrales, productos lcteos descremados y carne magra de vaca, de pescado y de ave.  Haga comidas pequeas durante el da en lugar de 3 comidas abundantes. Coma lentamente y en un lugar donde est distendido. Evite agacharse o recostarse hasta 2 o 3horas despus de haber comido.  Evite comer 2 a 3horas antes de ir a acostarse.  Evite beber grandes cantidades de lquidos con las comidas.  Evite frer los alimentos a la hora de la coccin. Puede hornear, grillar o asar a la parrilla.  Evite o limite la cantidad de: ? Chocolate. ? Menta y mentol. ? Alcohol. ? Pimienta. ? Caf negro y descafeinado. ? T negro y descafeinado. ? Bebidas con gas (gaseosas). ? Bebidas energizantes y refrescos que contengan cafena.  Limite los alimentos con alto contenido de grasas, por ejemplo: ? Carnes grasas o alimentos fritos. ? Leche entera, crema, manteca o helado. ? Nueces y mantequillas de frutos secos. ? Pastelera, donas y dulces hechos con manteca o margarina.  Evite los alimentos que le ocasionen sntomas. Estos pueden ser distintos para cada persona. Los alimentos que suelen causan sntomas son los siguientes: ? Tomates. ? Naranjas, limones y limas. ? Pimientos. ? Comidas condimentadas. ? Cebolla y ajo. ? Vinagre. Estilo de vida  Mantenga un peso saludable. Pregntele a su mdico cul es el peso saludable  para usted. Si necesita perder peso, hable con su mdico para hacerlo de manera segura.  Realice actividad fsica durante, al menos, 30 minutos 5 das por semana o ms, o segn lo indicado por su mdico.  Use ropa suelta.  No fume. Si necesita ayuda para dejar de fumar, consulte al mdico.  Duerma con la cabecera de la cama ms elevada que los pies. Use una cua debajo del colchn o bloques debajo del armazn de la cama para mantener la cabecera de la cama elevada. Resumen  Si tiene enfermedad de reflujo gastroesofgico (ERGE), las elecciones de alimentos y el estilo de vida son muy importantes para ayudar a aliviar los sntomas.  Haga comidas pequeas durante el da en lugar de 3 comidas abundantes. Coma lentamente y en un lugar donde est distendido.  Limite los alimentos con alto contenido graso como la carne grasa o los alimentos fritos.  Evite agacharse o recostarse hasta 2 o 3horas despus de haber comido.  Evite la menta y hierba buena, la cafena, el alcohol y el chocolate. Esta informacin no tiene como fin reemplazar el consejo del mdico. Asegrese de hacerle al mdico cualquier pregunta que tenga. Document Released: 09/21/2011 Document Revised: 10/26/2016 Document Reviewed: 10/26/2016 Elsevier Interactive Patient Education  2019 [employer]  Enfermedad de reflujo gastroesofgico en los adultos Gastroesophageal Reflux Disease, Adult El reflujo gastroesofgico (RGE) ocurre cuando el cido del estmago sube por el tubo que conecta la boca con el estmago (esfago). Normalmente, la comida baja por el esfago y se mantiene en el estmago, donde se la digiere. Cuando una persona tiene RGE, los alimentos y el cido estomacal suelen volver al esfago. Usted puede tener   una enfermedad llamada enfermedad de reflujo gastroesofgico (ERGE) si el reflujo:  Sucede a menudo.  Causa sntomas frecuentes o muy intensos.  Causa problemas tales como dao en el esfago. Cuando esto  ocurre, el esfago duele y se hincha (inflama). Con el tiempo, la ERGE puede ocasionar pequeos agujeros (lceras) en el revestimiento del esfago. Cules son las causas? Esta afeccin se debe a un problema en el msculo que se encuentra entre el esfago y el estmago. Cuando este msculo est dbil o no es normal, no se cierra correctamente para impedir que los alimentos y el cido regresen del estmago. El msculo puede debilitarse debido a lo siguiente:  El consumo de tabaco.  Embarazo.  Tener cierto tipo de hernia (hernia de hiato).  Consumo de alcohol.  Ciertos alimentos y bebidas, como caf, chocolate, cebollas y menta. Qu incrementa el riesgo? Es ms probable que tenga esta afeccin si:  Tiene sobrepeso.  Tiene una enfermedad que afecta el tejido conjuntivo.  Usa antiinflamatorios no esteroideos (AINE). Cules son los signos o los sntomas? Los sntomas de esta afeccin incluyen:  Acidez estomacal.  Dificultad o dolor al tragar.  Sensacin de tener un bulto en la garganta.  Sabor amargo en la boca.  Mal aliento.  Tener una gran cantidad de saliva.  Estmago inflamado o con malestar.  Eructos.  Dolor en el pecho. El dolor de pecho puede deberse a distintas afecciones. Asegrese de consultar a su mdico si tiene dolor en el pecho.  Falta de aire o respiracin ruidosa (sibilancias).  Tos constante (crnica) o durante la noche.  Desgaste de la superficie de los dientes (esmalte dental).  Prdida de peso. Cmo se trata? El tratamiento depender de la gravedad de los sntomas. El mdico puede sugerirle lo siguiente:  Cambios en la dieta.  Medicamentos.  Una ciruga. Siga estas indicaciones en su casa: Comida y bebida   Siga una dieta como se lo haya indicado el mdico. Es posible que deba evitar alimentos y bebidas, por ejemplo: ? Caf y t (con o sin cafena). ? Bebidas que contengan alcohol. ? Bebidas energticas y deportivas. ? Bebidas  gaseosas y refrescos. ? Chocolate y cacao. ? Menta y esencia de menta. ? Ajo y cebolla. ? Rbano picante. ? Alimentos cidos y condimentados. Estos incluyen todos los tipos de pimientos, chile en polvo, curry en polvo, vinagre, salsas picantes y salsa barbacoa. ? Ctricos y sus jugos, por ejemplo, naranjas, limones y limas. ? Alimentos que contengan tomate. Estos incluyen salsa roja, chile, salsa picante y pizza con salsa de tomate. ? Alimentos fritos y grasos. Estos incluyen donas, papas fritas, papitas fritas de bolsa y aderezos con alto contenido de grasa. ? Carnes con alto contenido de grasa. Estas incluye los perros calientes, chuletas o costillas, embutidos, jamn y tocino. ? Productos lcteos ricos en grasas, como leche entera, manteca y queso crema.  Consuma pequeas cantidades de comida con ms frecuencia. Evite consumir porciones abundantes.  Evite beber grandes cantidades de lquidos con las comidas.  Evite comer 2 o 3horas antes de acostarse.  Evite recostarse inmediatamente despus de comer.  No haga ejercicios enseguida despus de comer. Estilo de vida   No consuma ningn producto que contenga nicotina o tabaco. Estos incluyen cigarrillos, cigarrillos electrnicos y tabaco para mascar. Si necesita ayuda para dejar de fumar, consulte al mdico.  Intente reducir el nivel de estrs. Si necesita ayuda para hacer esto, consulte al mdico.  Si tiene sobrepeso, baje una cantidad de peso saludable para usted. Consulte a   su mdico para bajar de peso de manera segura. Indicaciones generales  Est atento a cualquier cambio en los sntomas.  Tome los medicamentos de venta libre y los recetados solamente como se lo haya indicado el mdico. No tome aspirina, ibuprofeno ni otros AINE a menos que el mdico lo autorice.  Use ropa holgada. No use nada apretado alrededor de la cintura.  Levante (eleve) la cabecera de la cama aproximadamente 6pulgadas (15cm).  Evite inclinarse si  al hacerlo empeoran los sntomas.  Concurra a todas las visitas de seguimiento como se lo haya indicado el mdico. Esto es importante. Comunquese con un mdico si:  Aparecen nuevos sntomas.  Adelgaza y no sabe por qu.  Tiene problemas para tragar o le duele cuando traga.  Tiene sibilancias o tos persistente.  Los sntomas no mejoran con el tratamiento.  Tiene la voz ronca. Solicite ayuda inmediatamente si:  Siente dolor en los brazos, el cuello, la mandbula, los dientes o la espalda.  Se siente transpirado, mareado o tiene una sensacin de desvanecimiento.  Siente falta de aire o dolor en el pecho.  Vomita y el vmito tiene un aspecto similar a la sangre o a los posos de caf.  Pierde el conocimiento (se desmaya).  Las deposiciones (heces) son sanguinolentas o negras.  No puede tragar, beber o comer. Resumen  Si una persona tiene enfermedad de reflujo gastroesofgico (ERGE), los alimentos y el cido estomacal suben al esfago y causan sntomas o problemas tales como dao en el esfago.  El tratamiento depender de la gravedad de los sntomas.  Siga una dieta como se lo haya indicado el mdico.  Tome todos los medicamentos solamente como se lo haya indicado el mdico. Esta informacin no tiene como fin reemplazar el consejo del mdico. Asegrese de hacerle al mdico cualquier pregunta que tenga. Document Released: 04/24/2010 Document Revised: 11/03/2017 Document Reviewed: 11/03/2017 Elsevier Interactive Patient Education  2019 Elsevier Inc.  

## 2018-09-22 NOTE — Progress Notes (Signed)
Established Patient Office Visit  Subjective:  Patient ID: [name], female    DOB: 10-08-1985  Age: 33 y.o. MRN: [id number]  CC:  Chief Complaint  Patient presents with  . Hospitalization Follow-up    HPI [name] presents for follow-up after recent urgent care visit secondary to onset of a red, itchy rash on her face, neck, arms, back and chest after she had started taking omeprazole for approximately 3 days due to upper abdominal/epigastric pain from reflux.  Patient states that she had seen GI recently but she was not aware that they had also sent in a prescription for a different medication for acid reflux, pantoprazole.  She is currently taking Pepcid which was prescribed in the emergency department for her allergic reaction and she actually thinks that this medication has been helping with her reflux symptoms as well.  Patient did not have any swelling of her lips, mouth or throat but did have an itchy rash and her skin felt warm and was red in the areas of the rash.  She was prescribed prednisone and the rash has resolved.  She was also having some nausea related to her reflux symptoms but this has resolved.  She did take Zofran as needed for nausea.  She denies any current nausea at today's visit.  Patient has some mild epigastric discomfort but it is much improved.  She denies any blood in the stool and no dark/black stools.  She denies any chest pain or palpitations.  No shortness of breath or cough.  She has had some fatigue.  Past Medical History:  Diagnosis Date  . Depression    hx - no meds    Past Surgical History:  Procedure Laterality Date  . CESAREAN SECTION   2004, 2010, 2013   x 3  . CHOLECYSTECTOMY N/A 09/03/2016   Procedure: LAPAROSCOPIC CHOLECYSTECTOMY;  Surgeon: Greer Pickerel, MD;  Location: [facility];  Service: General;  Laterality: N/A;    Family History  Problem Relation Age of Onset  . Hypertension Father   . Diabetes Sister    Social  History   Tobacco Use  . Smoking status: Former Smoker    Types: Cigars    Quit date: 06/04/2010    Years since quitting: 8.3  . Smokeless tobacco: Never Used  Substance Use Topics  . Alcohol use: Yes    Alcohol/week: 1.0 standard drinks    Types: 1 Cans of beer per week    Comment: socially  . Drug use: No     Outpatient Medications Prior to Visit  Medication Sig Dispense Refill  . famotidine (PEPCID) 20 MG tablet Take 1 tablet (20 mg total) by mouth 2 (two) times daily. 10 tablet 0  . loratadine (CLARITIN) 10 MG tablet Take 1 tablet (10 mg total) by mouth daily. 10 tablet 0  . predniSONE (STERAPRED UNI-PAK 21 TAB) 10 MG (21) TBPK tablet Dispense one 6 day pack. Take as directed with food. 21 tablet 0  . triamcinolone cream (KENALOG) 0.1 % Apply 1 application topically 2 (two) times daily. X 10 days for rash then use as needed 30 g 3  . ondansetron (ZOFRAN-ODT) 4 MG disintegrating tablet Take 1 tablet (4 mg total) by mouth every 6 (six) hours as needed for nausea. (Patient not taking: Reported on 05/16/2018) 20 tablet 0  . senna (SENOKOT) 8.6 MG TABS tablet Take 1 tablet (8.6 mg total) by mouth 2 (two) times daily. (Patient not taking: Reported on 09/22/2018) 120 each 0  No facility-administered medications prior to visit.     Allergies  Allergen Reactions  . Omeprazole Rash    ROS Review of Systems  Constitutional: Positive for fatigue. Negative for chills and fever.  HENT: Positive for postnasal drip. Negative for rhinorrhea, sore throat and trouble swallowing.   Eyes: Negative for photophobia and visual disturbance.  Respiratory: Negative for cough and shortness of breath.   Cardiovascular: Negative for chest pain, palpitations and leg swelling.  Gastrointestinal: Positive for nausea. Negative for abdominal pain, blood in stool, constipation and diarrhea.  Endocrine: Negative for polydipsia, polyphagia and polyuria.  Genitourinary: Negative for dysuria and frequency.    Musculoskeletal: Negative for back pain and gait problem.  Neurological: Negative for dizziness and headaches.  Hematological: Negative for adenopathy. Does not bruise/bleed easily.      Objective:    Physical Exam  Constitutional: She is oriented to person, place, and time. She appears well-developed and well-nourished. No distress.  Overweight for height/obese female in no acute distress.  No visible rash  Neck: Normal range of motion. Neck supple.  Cardiovascular: Normal rate and regular rhythm.  Pulmonary/Chest: Effort normal and breath sounds normal.  Abdominal: She exhibits distension (Mild abdominal distention). There is no abdominal tenderness. There is no rebound and no guarding.  Musculoskeletal:        General: No tenderness or edema.  Lymphadenopathy:    She has no cervical adenopathy.  Neurological: She is alert and oriented to person, place, and time.  Skin: Skin is warm and dry. No rash noted.  Psychiatric: She has a normal mood and affect. Her behavior is normal.  Nursing note and vitals reviewed.   BP 119/76 (BP Location: Right Arm, Patient Position: Sitting, Cuff Size: Large)   Pulse 87   Temp 98.8 F (37.1 C) (Oral)   Ht 4\' 11"  (1.499 m)   Wt 189 lb (85.7 kg)   LMP 08/27/2018   SpO2 96%   BMI 38.17 kg/m  Wt Readings from Last 3 Encounters:  09/22/18 189 lb (85.7 kg)  09/19/18 190 lb (86.2 kg)  08/07/18 190 lb (86.2 kg)     Health Maintenance Due  Topic Date Due  . TETANUS/TDAP  11/07/2002  . PAP SMEAR-Modifier  11/06/2004    There are no preventive care reminders to display for this patient.  Lab Results  Component Value Date   TSH 1.080 05/16/2018   Lab Results  Component Value Date   WBC 9.5 09/05/2016   HGB 9.3 (L) 09/05/2016   HCT 28.8 (L) 09/05/2016   MCV 88.6 09/05/2016   PLT 382 09/05/2016   Lab Results  Component Value Date   NA 139 05/16/2018   K 4.1 05/16/2018   CO2 21 05/16/2018   GLUCOSE 122 (H) 05/16/2018   BUN 10  05/16/2018   CREATININE 0.52 (L) 05/16/2018   BILITOT <0.2 08/02/2018   ALKPHOS 85 08/02/2018   AST 21 08/02/2018   ALT 24 08/02/2018   PROT 7.4 08/02/2018   ALBUMIN 5.0 (H) 08/02/2018   CALCIUM 9.5 05/16/2018   ANIONGAP 6 09/05/2016   No results found for: CHOL No results found for: HDL No results found for: LDLCALC No results found for: TRIG No results found for: [facility] Lab Results  Component Value Date   HGBA1C 5.7 (A) 08/02/2018      Assessment & Plan:  1. Gastroesophageal reflux disease, esophagitis presence not specified; 2. Nausea Patient status post allergic reaction with onset of urticaria/rash after the use of omeprazole.  She had been prescribed pantoprazole by GI in her recent visit in follow-up of her epigastric pain, nausea and reflux however she was unaware of the prescription and has never had this filled.  She is actually having some relief of her GI symptoms with the use of Pepcid which had been prescribed for her allergic reaction.  Hives and rash have resolved with use of prednisone and Pepcid.  She is encouraged to make sure that she eats prior to taking the remaining prednisone that she was prescribed to help avoid stomach upset.  She is very referred back to gastroenterology for further evaluation and treatment. - famotidine (PEPCID) 20 MG tablet; Take 1 tablet (20 mg total) by mouth 2 (two) times daily. To decrease stomach acid  Dispense: 60 tablet; Refill: 3 - Ambulatory referral to Gastroenterology   An After Visit Summary was printed and given to the patient.  Follow-up:  Return in about 4 months (around 01/22/2019) for GERD- follow-up with GI; chronic issues.   Cain Saupeammie Caroll Cunnington, MD

## 2018-09-26 ENCOUNTER — Telehealth: Payer: Self-pay | Admitting: Gastroenterology

## 2018-09-26 MED ORDER — PANTOPRAZOLE SODIUM 40 MG PO TBEC: 40.0000 mg | DELAYED_RELEASE_TABLET | Freq: Every day | ORAL | 2 refills | Status: DC

## 2018-09-26 NOTE — Telephone Encounter (Signed)
Patient said if she can have the med pantoprazole (PROTONIX) 40 MG  resent to the pharmacy again

## 2018-09-27 ENCOUNTER — Ambulatory Visit: Payer: Self-pay | Admitting: Family Medicine

## 2018-10-15 NOTE — Progress Notes (Signed)
The patient did not keep her appointment with me scheduled for today. Encounter had been started before the appointment was cancelled.

## 2018-10-16 ENCOUNTER — Encounter: Payer: Self-pay | Admitting: Gastroenterology

## 2018-10-16 ENCOUNTER — Ambulatory Visit: Payer: Self-pay | Admitting: Gastroenterology

## 2018-11-13 NOTE — Progress Notes (Deleted)
TELEHEALTH VISIT  Referring Provider: Antony Blackbird, MD Primary Care Physician:  Antony Blackbird, MD   Tele-visit due to COVID-19 pandemic Patient requested visit virtually, consented to the virtual encounter via audio enabled telemedicine application Contact made at: 11:00 am 08/07/18 Patient verified by name and date of birth Location of patient: Home Location provider: [place] [facility] Names of persons participating: Me, patient, Debbe Mounts CMA, interpreter through [employer]  Time spent on telehealth visit: 28 minutes I discussed the limitations of evaluation and management by telemedicine. The patient expressed understanding and agreed to proceed.  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 Less likely malignancy.    PLAN: Pantoprazole 40 mg daily - taken 30-60 minutes before eating CT of the abdomen and pelvis with contrast for abdominal pain Plan EGD if CT is negative  I consented the patient discussing the risks, benefits, and alternatives to endoscopic evaluation. In particular, we discussed the risks that include, but are not limited to, reaction to medication, cardiopulmonary compromise, bleeding requiring blood transfusion, aspiration resulting in pneumonia, perforation requiring surgery, lack of diagnosis, severe illness requiring hospitalization, and even death. We reviewed the risk of missed lesion including polyps or even cancer. The patient acknowledges these risks and asks that we proceed.  HPI: [name] is a 33 y.o. female  referred by Dr. Chapman Fitch for evaluation of right upper quadrant and epigastric pain.  History is obtained through the patient and review of her electronic health record with the assistance of a [place] interpreter.  Prior cholecystectomy 09/03/2016  Abdominal ultrasound IMPRESSION: 1. Positive for Acute Cholecystitis. 2.9 cm gallstone lodged in the neck of the gallbladder with edematous gallbladder wall. 2. No evidence of CBD obstruction, and otherwise negative ultrasound appearance of the abdomen.  CT scan IMPRESSION: Status post cholecystectomy with ill-defined 2 x 3 cm area fluid in the cholecystectomy bed, which may be postsurgical changes. No evidence of ascites/free fluid.  Mild CBD dilatation to the ampulla without obstructing cause, someone unexpected this soon after cholecystectomy. The CBD now measures 9 mm in diameter, previously 5 mm on 09/02/2016 ultrasound.  PRIOR HISTORY  Intermittent sharp right upper quadrant pain and epigastric pain described as a 6-7 out of 10. Initially started in her stomach and now moved to the RUQ quadrant. Similar to abdominal pain prior to cholecystectomy, but not as severe.  May may have improved initially after her cholecystectomy but symptoms quickly returned. Associated nausea.   Unable to eat sauces or spicy foods due to symptoms, otherwise, not associated with eating.  Improves with a deep breath or relaxation.  No association with position, or defecation.  Good appetite.  Stable weight.  No diarrhea, constipation, or change in bowel habits.  No melena, hematochezia or bright red blood per rectum.  Did not tolerate omeprazole to associated generalized pruritic rash that went away when she stopped the medication. Started famotidine but was unable to get it from the pharmacy. Has not tried any other medications. No NSAIDs. No other associated symptoms. No identified exacerbating or relieving features.   Labs from 05/16/2018 showing negative H. pylori  IgM and IgG.  Liver enzymes were normal including AST 20, ALT 17, alk phos 82, total bilirubin 0.2.  Lipase was 36.  Glucose was 122.  TSH was 1.080.  Abdominal ultrasound 09/02/16 for right upper quadrant pain showed acute cholecystitis with a 2.9 cm gallstone lodged in the neck of the gallbladder.  The patient subsequently had a cholecystectomy 09/03/2016.  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  The common bile duct measures 9 mm.  No known family history of colon cancer or polyps. No family history of uterine/endometrial cancer, pancreatic cancer or gastric/stomach cancer.  Past Medical History:  Diagnosis Date  . Depression    hx - no meds    Past Surgical History:  Procedure Laterality Date  . CESAREAN SECTION   2004, 2010, 2013   x 3  . CHOLECYSTECTOMY N/A 09/03/2016   Procedure: LAPAROSCOPIC CHOLECYSTECTOMY;  Surgeon: Greer Pickerel, MD;  Location: [facility];  Service: General;  Laterality: N/A;    Current Outpatient Medications  Medication Sig Dispense Refill  . famotidine (PEPCID) 20 MG tablet Take 1 tablet (20 mg total) by mouth 2 (two) times daily. To decrease stomach acid 60 tablet 3  . loratadine (CLARITIN) 10 MG tablet Take 1 tablet (10 mg total) by mouth daily. 10 tablet 0  . ondansetron (ZOFRAN-ODT) 4 MG disintegrating tablet Take 1 tablet (4 mg total) by mouth every 6 (six) hours as needed for nausea. (Patient not taking: Reported on 05/16/2018) 20 tablet 0  . pantoprazole (PROTONIX) 40 MG tablet Take 1 tablet (40 mg total) by mouth daily. 30 tablet 2  . predniSONE (STERAPRED UNI-PAK 21 TAB) 10 MG (21) TBPK tablet Dispense one 6 day pack. Take as directed with food. 21 tablet 0  . senna  (SENOKOT) 8.6 MG TABS tablet Take 1 tablet (8.6 mg total) by mouth 2 (two) times daily. (Patient not taking: Reported on 09/22/2018) 120 each 0  . triamcinolone cream (KENALOG) 0.1 % Apply 1 application topically 2 (two) times daily. X 10 days for rash then use as needed 30 g 3   No current facility-administered medications for this visit.     Allergies as of 11/14/2018 - Review Complete 10/16/2018  Allergen Reaction Noted  . Omeprazole Rash 08/02/2018    Family History  Problem Relation Age of Onset  . Hypertension Father   . Diabetes Sister     Social History   Socioeconomic History  . Marital status: Single    Spouse name: Not on file  . Number of children: Not on file  . Years of education: Not on file  . Highest education level: Not on file  Occupational History  . Not on file  Social Needs  . Financial resource strain: Not on file  . Food insecurity    Worry: Not on file    Inability: Not on file  . Transportation needs    Medical: Not on file    Non-medical: Not on file  Tobacco Use  . Smoking status: Former Smoker    Types: Cigars    Quit date: 06/04/2010    Years since quitting: 8.4  . Smokeless tobacco: Never Used  Substance and Sexual Activity  . Alcohol use: Yes    Alcohol/week: 1.0 standard drinks  Types: 1 Cans of beer per week    Comment: socially  . Drug use: No  . Sexual activity: Yes    Birth control/protection: None    Comment: pregnant  Lifestyle  . Physical activity    Days per week: Not on file    Minutes per session: Not on file  . Stress: Not on file  Relationships  . Social Herbalist on phone: Not on file    Gets together: Not on file    Attends religious service: Not on file    Active member of club or organization: Not on file    Attends meetings of clubs or organizations: Not on file    Relationship status: Not on file  . Intimate partner violence    Fear of current or ex partner: Not on file    Emotionally abused:  Not on file    Physically abused: Not on file    Forced sexual activity: Not on file  Other Topics Concern  . Not on file  Social History Narrative  . Not on file    Review of Systems: ALL ROS discussed and all others negative except listed in HPI.  Physical Exam: General: in no acute distress Neuro: Alert and appropriate Psych: Normal affect and normal insight   Nicky Milhouse L. Tarri Glenn, MD, MPH [facility] 11/13/2018, 4:52 PM

## 2018-11-14 ENCOUNTER — Ambulatory Visit: Payer: Self-pay | Admitting: Gastroenterology

## 2019-01-22 ENCOUNTER — Ambulatory Visit: Payer: Self-pay | Admitting: Family Medicine

## 2019-12-27 ENCOUNTER — Ambulatory Visit: Payer: Self-pay | Attending: Family Medicine | Admitting: Pharmacist

## 2019-12-27 ENCOUNTER — Other Ambulatory Visit: Payer: Self-pay

## 2019-12-27 DIAGNOSIS — Z23 Encounter for immunization: Secondary | ICD-10-CM

## 2019-12-27 NOTE — Progress Notes (Signed)
Patient presents for vaccination against influenza per orders of Dr. Fulp. Consent given. Counseling provided. No contraindications exists. Vaccine administered without incident.   Luke Van Ausdall, PharmD, CPP Clinical Pharmacist [facility] [phone]  

## 2021-01-27 ENCOUNTER — Ambulatory Visit (HOSPITAL_COMMUNITY)
Admission: RE | Admit: 2021-01-27 | Discharge: 2021-01-27 | Disposition: A | Discharge status: Home / Self Care | Payer: Self-pay | Source: Ambulatory Visit | Attending: Physician Assistant | Admitting: Physician Assistant

## 2021-01-27 ENCOUNTER — Other Ambulatory Visit: Payer: Self-pay

## 2021-01-27 ENCOUNTER — Ambulatory Visit: Payer: Self-pay | Attending: Physician Assistant | Admitting: Physician Assistant

## 2021-01-27 ENCOUNTER — Encounter: Payer: Self-pay | Admitting: Physician Assistant

## 2021-01-27 VITALS — BP 113/72 | HR 87 | Temp 98.8°F | Resp 18 | Ht <= 58 in | Wt 180.0 lb

## 2021-01-27 DIAGNOSIS — M25562 Pain in left knee: Secondary | ICD-10-CM

## 2021-01-27 DIAGNOSIS — E6609 Other obesity due to excess calories: Secondary | ICD-10-CM

## 2021-01-27 DIAGNOSIS — Z6837 Body mass index (BMI) 37.0-37.9, adult: Secondary | ICD-10-CM

## 2021-01-27 DIAGNOSIS — G8929 Other chronic pain: Secondary | ICD-10-CM

## 2021-01-27 DIAGNOSIS — K5909 Other constipation: Secondary | ICD-10-CM

## 2021-01-27 DIAGNOSIS — R7309 Other abnormal glucose: Secondary | ICD-10-CM

## 2021-01-27 LAB — POCT GLYCOSYLATED HEMOGLOBIN (HGB A1C): Hemoglobin A1C: 5.5 % (ref 4.0–5.6)

## 2021-01-27 NOTE — Progress Notes (Signed)
Established Patient Office Visit  Subjective:  Patient ID: [name], female    DOB: [birth year]  Age: 35 y.o. MRN: [id number]  CC:  Chief Complaint  Patient presents with   Establish Care    HPI [name] reports that she has been suffering from left knee pain for the past year injury or trauma.  States that she does experience swelling approximately once a month, states that when that occurs it will last an entire week.  Reports that she has been using icy hot and naproxen with some relief.  Denies numbness or tingling.  States that she had had gallladder removed a few years ago, states that she has difficulties mananging her BM then.States that she will start to feel constipated after not having a bowel movement for several days and then will use ducolax with relief.  States that she drinks approx 4 bottles of water a day   Due to language barrier, an interpreter was present during the history-taking and subsequent discussion (and for part of the physical exam) with this patient.      Past Medical History:  Diagnosis Date   Depression    hx - no meds    Past Surgical History:  Procedure Laterality Date   CESAREAN SECTION   2004, 2010, 2013   x 3   CHOLECYSTECTOMY N/A 09/03/2016   Procedure: LAPAROSCOPIC CHOLECYSTECTOMY;  Surgeon: Gaynelle Adu, MD;  Location: [facility] OR;  Service: General;  Laterality: N/A;    Family History  Problem Relation Age of Onset   Hypertension Father    Diabetes Sister     Social History   Socioeconomic History   Marital status: Single    Spouse name: Not on file   Number of children: Not on file   Years of education: Not on file   Highest education level: Not on file  Occupational History   Not on file  Tobacco Use   Smoking status: Former    Types: Cigars    Quit date: 06/04/2010    Years since quitting: 10.6   Smokeless tobacco: Never  Vaping Use   Vaping Use: Never used  Substance and Sexual Activity   Alcohol  use: Yes    Alcohol/week: 1.0 standard drink    Types: 1 Cans of beer per week    Comment: socially   Drug use: No   Sexual activity: Yes    Birth control/protection: None  Other Topics Concern   Not on file  Social History Narrative   Not on file   Social Determinants of Health   Financial Resource Strain: Not on file  Food Insecurity: Not on file  Transportation Needs: Not on file  Physical Activity: Not on file  Stress: Not on file  Social Connections: Not on file  Intimate Partner Violence: Not on file    Outpatient Medications Prior to Visit  Medication Sig Dispense Refill   famotidine (PEPCID) 20 MG tablet Take 1 tablet (20 mg total) by mouth 2 (two) times daily. To decrease stomach acid (Patient not taking: Reported on 01/27/2021) 60 tablet 3   loratadine (CLARITIN) 10 MG tablet Take 1 tablet (10 mg total) by mouth daily. (Patient not taking: Reported on 01/27/2021) 10 tablet 0   ondansetron (ZOFRAN-ODT) 4 MG disintegrating tablet Take 1 tablet (4 mg total) by mouth every 6 (six) hours as needed for nausea. (Patient not taking: No sig reported) 20 tablet 0   pantoprazole (PROTONIX) 40 MG tablet Take 1 tablet (40 mg total)  by mouth daily. (Patient not taking: Reported on 01/27/2021) 30 tablet 2   senna (SENOKOT) 8.6 MG TABS tablet Take 1 tablet (8.6 mg total) by mouth 2 (two) times daily. (Patient not taking: Reported on 09/22/2018) 120 each 0   triamcinolone cream (KENALOG) 0.1 % Apply 1 application topically 2 (two) times daily. X 10 days for rash then use as needed (Patient not taking: Reported on 01/27/2021) 30 g 3   predniSONE (STERAPRED UNI-PAK 21 TAB) 10 MG (21) TBPK tablet Dispense one 6 day pack. Take as directed with food. 21 tablet 0   No facility-administered medications prior to visit.    Allergies  Allergen Reactions   Omeprazole Rash    ROS Review of Systems  Constitutional: Negative.   HENT: Negative.    Eyes: Negative.   Respiratory:  Negative for  shortness of breath.   Cardiovascular:  Negative for chest pain.  Gastrointestinal:  Positive for constipation. Negative for abdominal pain, anal bleeding, blood in stool and rectal pain.  Endocrine: Negative.   Genitourinary: Negative.   Musculoskeletal:  Positive for arthralgias, joint swelling and myalgias.  Skin: Negative.   Allergic/Immunologic: Negative.   Neurological: Negative.   Hematological: Negative.   Psychiatric/Behavioral: Negative.       Objective:    Physical Exam Vitals and nursing note reviewed.  Constitutional:      Appearance: Normal appearance. She is obese.  HENT:     Head: Normocephalic and atraumatic.     Right Ear: External ear normal.     Left Ear: External ear normal.     Nose: Nose normal.     Mouth/Throat:     Mouth: Mucous membranes are moist.     Pharynx: Oropharynx is clear.  Eyes:     Extraocular Movements: Extraocular movements intact.     Conjunctiva/sclera: Conjunctivae normal.     Pupils: Pupils are equal, round, and reactive to light.  Cardiovascular:     Rate and Rhythm: Normal rate and regular rhythm.     Pulses: Normal pulses.     Heart sounds: Normal heart sounds.  Pulmonary:     Effort: Pulmonary effort is normal.     Breath sounds: Normal breath sounds.  Abdominal:     General: Abdomen is flat. Bowel sounds are normal.     Palpations: Abdomen is soft.  Musculoskeletal:        General: Normal range of motion.     Cervical back: Normal range of motion and neck supple.     Right knee: Normal.     Left knee: Bony tenderness present. No swelling. Tenderness present.  Skin:    General: Skin is warm and dry.  Neurological:     General: No focal deficit present.     Mental Status: She is alert and oriented to person, place, and time.  Psychiatric:        Mood and Affect: Mood normal.        Behavior: Behavior normal.        Thought Content: Thought content normal.        Judgment: Judgment normal.    BP 113/72 (BP  Location: Left Arm, Patient Position: Sitting, Cuff Size: Normal)   Pulse 87   Temp 98.8 F (37.1 C) (Oral)   Resp 18   Ht 4\' 10"  (1.473 m)   Wt 180 lb (81.6 kg)   LMP 01/25/2021   SpO2 96%   BMI 37.62 kg/m  Wt Readings from Last 3 Encounters:  01/27/21 180  lb (81.6 kg)  09/22/18 189 lb (85.7 kg)  09/19/18 190 lb (86.2 kg)     Health Maintenance Due  Topic Date Due   COVID-19 Vaccine (1) Never done   Hepatitis C Screening  Never done   TETANUS/TDAP  Never done   PAP SMEAR-Modifier  Never done   INFLUENZA VACCINE  11/03/2020    There are no preventive care reminders to display for this patient.  Lab Results  Component Value Date   TSH 1.080 05/16/2018   Lab Results  Component Value Date   WBC 9.5 09/05/2016   HGB 9.3 (L) 09/05/2016   HCT 28.8 (L) 09/05/2016   MCV 88.6 09/05/2016   PLT 382 09/05/2016   Lab Results  Component Value Date   NA 139 05/16/2018   K 4.1 05/16/2018   CO2 21 05/16/2018   GLUCOSE 122 (H) 05/16/2018   BUN 10 05/16/2018   CREATININE 0.52 (L) 05/16/2018   BILITOT <0.2 08/02/2018   ALKPHOS 85 08/02/2018   AST 21 08/02/2018   ALT 24 08/02/2018   PROT 7.4 08/02/2018   ALBUMIN 5.0 (H) 08/02/2018   CALCIUM 9.5 05/16/2018   ANIONGAP 6 09/05/2016   No results found for: CHOL No results found for: HDL No results found for: LDLCALC No results found for: TRIG No results found for: CHOLHDL Lab Results  Component Value Date   HGBA1C 5.5 01/27/2021      Assessment & Plan:   Problem List Items Addressed This Visit   None Visit Diagnoses     Chronic pain of left knee    -  Primary   Relevant Orders   DG Knee 1-2 Views Left (Completed)   Elevated random blood glucose level       Relevant Orders   HgB A1c (Completed)   Other constipation           No orders of the defined types were placed in this encounter. 1. Chronic pain of left knee Patient education given on supportive care, encouraged bracing, icing.  Red flags given for  prompt reevaluation. - DG Knee 1-2 Views Left; Future  2. Elevated random blood glucose level A1c 5.5 - HgB A1c  3. Other constipation Patient education given on supportive care, trial of MiraLAX over-the-counter.   I have reviewed the patient's medical history (PMH, PSH, Social History, Family History, Medications, and allergies) , and have been updated if relevant. I spent 30 minutes reviewing chart and  face to face time with patient.     Follow-up: Return if symptoms worsen or fail to improve.    Kasandra Knudsen Mayers, PA-C

## 2021-01-27 NOTE — Patient Instructions (Addendum)
We will call you with the xray result.  Continue using the brace, icing, icy hot and pain relievers.  I encourage you to start using MiraLAX on a daily basis to help with your constipation, make sure that you are drinking at least 64 ounces of water and eating a high-fiber diet.  You can use one half capful to 1 full capful of MiraLAX based on your needs.  The most important component is to be consistent.  You will purchase MiraLAX over-the-counter.  Roney Jaffe, PA-C Physician Assistant [facility] [email or web address]  Dolor agudo de rodilla en los adultos Acute Knee Pain, Adult El dolor agudo de rodilla es repentino y puede deberse a dao, hinchazn o irritacin de los msculos y tejidos que sostienen la rodilla. El dolor puede ser consecuencia de lo siguiente: Cruzita Lederer. Una lesin en la rodilla debido a movimientos de rotacin. Un golpe en la rodilla. Una infeccin. El dolor agudo de Engineer, manufacturing systems solo con el tiempo y el descanso. De no ser as, su mdico podra indicarle que se haga estudios para hallar la causa del dolor. Pueden incluir: Estudios de diagnstico por imgenes, como una radiografa, una resonancia magntica (RM), una exploracin por tomografa computarizada (TC) o una ecografa. Aspiracin de una articulacin. En [employer], se extrae lquido de la rodilla y se evala. Artroscopia. En [employer], se introduce un tubo iluminado en la rodilla y se proyecta una imagen en una pantalla de televisin. Biopsia. En [employer], se toma [place] de tejido del organismo y se la estudia con un microscopio. Siga estas instrucciones en su casa: Si tiene una rodillera o un dispositivo ortopdico:  Use la rodillera o el dispositivo ortopdico como se lo haya indicado el mdico. Quteselos solamente como se lo haya indicado el mdico. Afljelos si siente hormigueo en los dedos del pie, se le adormecen o se le  enfran y se tornan azulados. Mantngalos limpios. Si la rodillera o el dispositivo ortopdico no son impermeables: No deje que se mojen. Cbralos con un envoltorio hermtico cuando tome un bao de inmersin o una ducha. Actividad Descanse la rodilla. No haga cosas que le causen dolor o que lo intensifiquen. Evite las actividades o los ejercicios de alto impacto, como correr, Public relations account executive soga o hacer saltos de tijera. Trabaje con un fisioterapeuta para crear un programa de ejercicios seguros, segn lo recomiende el mdico. Haga ejercicios como se lo haya indicado el fisioterapeuta. Control del dolor, la rigidez y la hinchazn  Si se lo indican, aplique hielo sobre la rodilla afectada. Para hacer esto: Si [place] una rodillera o un dispositivo ortopdico desmontables, quteselos segn las indicaciones del mdico. Ponga el hielo en una bolsa plstica. Coloque una toalla entre la piel y Copy. Aplique el hielo durante 20 minutos, 2 o 3 veces por da. Retire el hielo si la piel se pone de color rojo brillante. Esto es Intel. Si no puede sentir dolor, calor o fro, tiene un mayor riesgo de que se dae la zona. Si se lo indican, use una venda elstica para ejercer presin (compresin) en la rodilla lesionada. Esto puede controlar la hinchazn, darle sostn y ayudar a Paramedic las [place]. Cuando est sentado o acostado, levante (eleve) la rodilla por encima del nivel del corazn. Duerma con una almohada debajo de la rodilla. Indicaciones generales Use los medicamentos de venta libre y los recetados solamente como se lo haya indicado el mdico. No consuma ningn producto que contenga nicotina o tabaco,  como cigarrillos, cigarrillos electrnicos y tabaco de Theatre manager. Si necesita ayuda para dejar de consumir estos productos, consulte al [employer]. Si tiene sobrepeso, trabaje con el mdico y un nutricionista para establecer una meta de prdida de peso que sea saludable y razonable para usted. El  exceso de peso puede generar presin en la rodilla. Est atento a cualquier cambio en los sntomas. Cumpla con todas las visitas de seguimiento. Esto es importante. Comunquese con un mdico si: El dolor de [place], [place]. Tiene fiebre junto con dolor de rodilla. La rodilla se siente caliente al tacto o est enrojecida. La rodilla se le tuerce o se le traba. Solicite ayuda de inmediato si: La rodilla se hincha, y la hinchazn empeora. No puede mover la rodilla. Tiene un dolor intenso en la rodilla que no se puede controlar con analgsicos. Resumen El dolor agudo de rodilla puede deberse a una cada, una lesin, una infeccin o a dao, hinchazn o irritacin de los tejidos que sostienen la rodilla. El mdico podra hacerle estudios para hallar la causa del dolor. Est atento a cualquier cambio en los sntomas. [employer] dolor con descanso, medicamentos, actividad de poca intensidad y Kenneth de hielo. Obtenga ayuda de inmediato si la rodilla se hincha, no puede mover la rodilla o tiene un dolor intenso que no se puede controlar con medicamentos. Esta informacin no tiene Theme park manager el consejo del mdico. Asegrese de hacerle al mdico cualquier pregunta que tenga. Document Revised: 10/23/2019 Document Reviewed: 10/23/2019 Elsevier Patient Education  2022 Elsevier Inc.   Estreimiento, en adultos Constipation, Adult Estreimiento significa que una persona hace menos de tres deposiciones en una semana, tiene dificultades para defecar o las heces (deposiciones) son secas, duras o ms grandes de lo normal. La causa puede ser una afeccin subyacente. Puede empeorar con la edad si una persona toma ciertos medicamentos y no toma suficiente lquido. Siga estas instrucciones en su casa: Comida y bebida  Consuma alimentos con alto contenido de [place], como frijoles, cereales integrales, y frutas y verduras frescas. Limite el consumo de alimentos que sean bajos en fibra y  ricos en grasas y azcares procesados, como los fritos y los dulces. Estos incluyen patatas fritas, hamburguesas, galletas, dulces y refrescos. Beba suficiente lquido como para Pharmacologist la orina de color amarillo plido. Instrucciones generales Haga actividad fsica habitualmente o como se lo haya indicado el mdico. Intente practicar 150 minutos de actividad fsica moderada por semana. Vaya al bao siempre que sienta ganas de defecar. No se aguante las ganas. Use los medicamentos de venta libre y los recetados solamente como se lo haya indicado el mdico. Esto incluye suplementos de [place]. En el momento de hacer sus deposiciones: Respire profundamente mientras relaja la parte inferior del abdomen. Practique la relajacin del suelo plvico. Controle su afeccin para detectar cualquier cambio. Informe a su mdico acerca de cualquier cambio. Concurra a todas las visitas de [address] se lo haya indicado el mdico. Esto es importante. Comunquese con un mdico si: Su dolor empeora. Tiene fiebre. No defeca despus de 4 das. Vomita. Baja de [place] o no tiene apetito. Tiene sangrado por la abertura entre las nalgas (ano). Las heces son delgadas como un lpiz. Solicite ayuda de inmediato si: Lance Muss, y los sntomas empeoran repentinamente. Observa que se filtran heces o hay sangre en las heces. Tiene el abdomen distendido. Siente un dolor intenso en el abdomen. Se siente mareado o se desmaya. Resumen Estreimiento significa que una persona hace menos de  tres deposiciones en una semana, tiene dificultades para defecar o las heces (deposiciones) son secas, duras o ms grandes de lo normal. Consuma alimentos con alto contenido de [place], como frijoles, cereales integrales, y frutas y verduras frescas. Beba suficiente lquido como para Pharmacologist la orina de color amarillo plido. Use los medicamentos de venta libre y los recetados solamente como se lo haya indicado el mdico. Esto incluye  suplementos de [place]. Esta informacin no tiene Theme park manager el consejo del mdico. Asegrese de hacerle al mdico cualquier pregunta que tenga. Document Revised: 04/27/2019 Document Reviewed: 04/27/2019 Elsevier Patient Education  2022 [employer].

## 2021-01-29 DIAGNOSIS — Z6837 Body mass index (BMI) 37.0-37.9, adult: Secondary | ICD-10-CM | POA: Insufficient documentation

## 2021-01-29 DIAGNOSIS — E6609 Other obesity due to excess calories: Secondary | ICD-10-CM | POA: Insufficient documentation

## 2021-01-29 DIAGNOSIS — G8929 Other chronic pain: Secondary | ICD-10-CM | POA: Insufficient documentation

## 2021-01-29 DIAGNOSIS — K5909 Other constipation: Secondary | ICD-10-CM | POA: Insufficient documentation

## 2021-01-29 NOTE — Addendum Note (Signed)
Addended by: Roney Jaffe on: 01/29/2021 11:14 AM   Modules accepted: Orders

## 2021-02-02 ENCOUNTER — Other Ambulatory Visit: Payer: Self-pay

## 2021-02-02 ENCOUNTER — Ambulatory Visit: Payer: Self-pay | Attending: Family Medicine

## 2021-02-02 ENCOUNTER — Ambulatory Visit: Payer: Self-pay

## 2021-02-02 DIAGNOSIS — Z23 Encounter for immunization: Secondary | ICD-10-CM

## 2021-02-03 ENCOUNTER — Telehealth: Payer: Self-pay | Admitting: *Deleted

## 2021-02-03 NOTE — Telephone Encounter (Signed)
-----   Message from Roney Jaffe, [place] sent at 01/29/2021 11:14 AM EDT ----- Please call patient and let her know that the x-ray of her left knee did not show any abnormalities.  Due to length of time she has had knee pain, I will refer her to orthopedics for further evaluation.

## 2021-02-03 NOTE — Telephone Encounter (Signed)
Medical Assistant used [employer] to contact patient.  Interpreter Name: Mackie Pai Interpreter #: [phone] Patient verified DOB Patient is aware of no concerns being noted on the xray. Due to continued pain she is referred to orthopedic for further evaluation.

## 2021-02-18 ENCOUNTER — Ambulatory Visit (INDEPENDENT_AMBULATORY_CARE_PROVIDER_SITE_OTHER): Payer: Self-pay | Admitting: Surgical

## 2021-02-18 ENCOUNTER — Other Ambulatory Visit: Payer: Self-pay

## 2021-02-18 DIAGNOSIS — M25562 Pain in left knee: Secondary | ICD-10-CM

## 2021-02-19 ENCOUNTER — Encounter: Payer: Self-pay | Admitting: Orthopedic Surgery

## 2021-02-19 NOTE — Progress Notes (Signed)
Office Visit Note   Patient: [name]           Date of Birth: 11/30/1985           MRN: [id number] Visit Date: 02/18/2021 Requested by: Mayers, Kasandra Knudsen, PA-C [address] [place],  [place] [address] PCP: Cain Saupe, MD (Inactive)  Subjective: Chief Complaint  Patient presents with   Left Knee - Pain    HPI: [name] is a 35 y.o. female who presents to the office complaining of left knee pain.  Patient complains of medial pain for the last year.  She denies any history of injury.  She complains of pain mostly localized to the medial aspect of the medial femoral condyle.  Pain is worse with pressure to this area.  She has difficulty sleeping due to the pain and cannot really put her knees together because the pressure keeps her awake.  She has to sleep with her knees offset from each other or with a pillow between her legs.  No history of injury or surgery to the left knee.  Denies any mechanical symptoms but does feel that her left knee is weak and gives out on her sometimes when she walks.  She has tried extended courses of Tylenol and ibuprofen without relief.  She has tried home exercise program that she saw on YouTube without any improvement of her symptoms.  Denies any history of diabetes or smoking.  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  The most relief she gets is when she puts icy hot patches over the affected area and tightly wrapped her leg with the IcyHot patch on with Saran wrap.  Denies any groin pain or radicular pain.  No numbness or tingling..                ROS: All systems reviewed are negative as they relate to the chief complaint within the history of present illness.  Patient denies fevers or chills.  Assessment & Plan: Visit Diagnoses:  1. Left knee pain, unspecified chronicity     Plan: Patient is a 35 year old female who presents for evaluation of chronic left knee pain.  Localizes pain to the medial aspect of the knee.  She states  that she feels her medial left knee increases and decreases in size at times but she feels swelling has been becoming persistently larger.  She has medial joint line tenderness on exam without effusion.  Radiographs of the left knee taken previously demonstrate no significant degenerative changes or any pathology to explain her pain.  She has exquisite tenderness over the area of the medial knee where the proximal MCL attaches.  There is no palpable mass but ultrasound examination does seem to show a subtle, small, ovular hyperechoic mass that is concerning for lipoma based on appearance.  Plan for further evaluation with MRI scan for further delineation of potential mass versus evaluation of any intra-articular pathology that may contribute to [name]'s pain.  She has failed conservative management given her lack of improvement with steady course of anti-inflammatories and lack of improvement with a exercise program.  Follow-up after MRI to review results.  Follow-Up Instructions: No follow-ups on file.   Orders:  Orders Placed This Encounter  Procedures   MR Knee Left w/o contrast   No orders of the defined types were placed in this encounter.     Procedures: No procedures performed   Clinical Data: No additional findings.  Objective: Vital Signs: LMP 01/25/2021  Physical Exam:  Constitutional: Patient appears well-developed HEENT:  Head: Normocephalic Eyes:EOM are normal Neck: Normal range of motion Cardiovascular: Normal rate Pulmonary/chest: Effort normal Neurologic: Patient is alert Skin: Skin is warm Psychiatric: Patient has normal mood and affect  Ortho Exam: Ortho exam demonstrates left knee without effusion.  She does have medial joint line tenderness over the posterior medial joint line.  No tenderness over the lateral joint line, patellar tendon, patella, quadricep tendon.  She also has tenderness over the medial aspect of the proximal knee near the proximal MCL  attachment.  There is no instability with varus or valgus stress at 0 or 30 degrees.  No asymmetric laxity compared to the contralateral knee of anterior posterior drawer.  Negative Lachman exam.  Able to perform straight leg raise without extensor lag.  There is no significantly palpable mass but there does seem to be a slight asymmetric swelling of the medial left knee compared with the medial aspect of the right knee.  Specialty Comments:  No specialty comments available.  Imaging: No results found.   PMFS History: Patient Active Problem List   Diagnosis Date Noted   Chronic pain of left knee 01/29/2021   Other constipation 01/29/2021   Class 2 obesity due to excess calories without serious comorbidity with body mass index (BMI) of 37.0 to 37.9 in adult 01/29/2021   Acute calculous cholecystitis 09/02/2016   Pregnancy complicated by IUD (intrauterine device) 10/16/2010   Past Medical History:  Diagnosis Date   Depression    hx - no meds    Family History  Problem Relation Age of Onset   Hypertension Father    Diabetes Sister     Past Surgical History:  Procedure Laterality Date   CESAREAN SECTION   2004, 2010, 2013   x 3   CHOLECYSTECTOMY N/A 09/03/2016   Procedure: LAPAROSCOPIC CHOLECYSTECTOMY;  Surgeon: Gaynelle Adu, MD;  Location: [facility] OR;  Service: General;  Laterality: N/A;   Social History   Occupational History   Not on file  Tobacco Use   Smoking status: Former    Types: Cigars    Quit date: 06/04/2010    Years since quitting: 10.7   Smokeless tobacco: Never  Vaping Use   Vaping Use: Never used  Substance and Sexual Activity   Alcohol use: Yes    Alcohol/week: 1.0 standard drink    Types: 1 Cans of beer per week    Comment: socially   Drug use: No   Sexual activity: Yes    Birth control/protection: None

## 2021-03-12 ENCOUNTER — Other Ambulatory Visit: Payer: Self-pay

## 2021-03-12 ENCOUNTER — Ambulatory Visit
Admission: RE | Admit: 2021-03-12 | Discharge: 2021-03-12 | Disposition: A | Discharge status: Home / Self Care | Payer: No Typology Code available for payment source | Source: Ambulatory Visit | Attending: Orthopedic Surgery | Admitting: Orthopedic Surgery

## 2021-03-12 DIAGNOSIS — M25562 Pain in left knee: Secondary | ICD-10-CM

## 2021-03-16 ENCOUNTER — Ambulatory Visit: Payer: Self-pay

## 2021-03-16 ENCOUNTER — Other Ambulatory Visit: Payer: Self-pay

## 2021-03-16 ENCOUNTER — Ambulatory Visit (INDEPENDENT_AMBULATORY_CARE_PROVIDER_SITE_OTHER): Payer: Self-pay | Admitting: Orthopedic Surgery

## 2021-03-16 DIAGNOSIS — M25562 Pain in left knee: Secondary | ICD-10-CM

## 2021-03-17 ENCOUNTER — Encounter: Payer: Self-pay | Admitting: Family Medicine

## 2021-03-17 ENCOUNTER — Other Ambulatory Visit: Payer: Self-pay

## 2021-03-17 ENCOUNTER — Ambulatory Visit: Payer: No Typology Code available for payment source | Attending: Family Medicine | Admitting: Family Medicine

## 2021-03-17 VITALS — BP 106/72 | HR 75 | Ht 59.0 in | Wt 186.8 lb

## 2021-03-17 DIAGNOSIS — G5602 Carpal tunnel syndrome, left upper limb: Secondary | ICD-10-CM

## 2021-03-17 DIAGNOSIS — M25562 Pain in left knee: Secondary | ICD-10-CM

## 2021-03-17 DIAGNOSIS — K0510 Chronic gingivitis, plaque induced: Secondary | ICD-10-CM

## 2021-03-17 DIAGNOSIS — Z1159 Encounter for screening for other viral diseases: Secondary | ICD-10-CM

## 2021-03-17 DIAGNOSIS — G8929 Other chronic pain: Secondary | ICD-10-CM

## 2021-03-17 DIAGNOSIS — Z13228 Encounter for screening for other metabolic disorders: Secondary | ICD-10-CM

## 2021-03-17 MED ORDER — PREDNISONE 20 MG PO TABS
20.0000 mg | ORAL_TABLET | Freq: Every day | ORAL | 0 refills | Status: DC
  Filled 2021-03-17: qty 5, 5d supply, fill #0

## 2021-03-17 NOTE — Patient Instructions (Addendum)
Sndrome del tnel carpiano Carpal Tunnel Syndrome El sndrome del tnel carpiano es una afeccin que causa dolor, adormecimiento y debilidad en la mano y los dedos. El tnel carpiano es un rea estrecha ubicada en el lado palmar de la [place]. Los movimientos repetidos de la mueca o determinadas enfermedades pueden causar la hinchazn del tnel. Esta hinchazn comprime el nervio principal de la [place]. El nervio principal de la [place] se llama nervio mediano. Cules son las causas? Esta afeccin puede ser causada por lo siguiente: Movimientos repetidos y enrgicos de la [place] y Engineer, site. Lesiones en la [place]. Artritis. Un quiste o un tumor en el tnel carpiano. Acumulacin de lquido Academic librarian. Uso de herramientas que vibran. A veces, se desconoce la causa de esta afeccin. Qu incrementa el riesgo? Los siguientes factores pueden hacer que sea ms propenso a Aeronautical engineer afeccin: Tener un trabajo que requiera que mueva la mueca o la mano repetitivamente o enrgicamente o que utilice herramientas que vibran. Estos pueden ser, [employer], los trabajos que implican usar una computadora, trabajar en una lnea de ensamblaje o trabajar con herramientas elctricas como taladros y lijadoras. Ser mujer. Tener ciertas afecciones, tales como: Diabetes. Obesidad. Tiroides hipoactiva (hipotiroidismo). Insuficiencia renal. Artritis reumatoide. Cules son los signos o sntomas? Los sntomas de esta afeccin incluyen: Sensacin de hormigueo en los dedos de la mano, especialmente el pulgar, el ndice y el dedo [place]. Hormigueo o adormecimiento en la mano. Sensacin de Engineer, mining en todo el brazo, especialmente cuando la [place] y el codo estn flexionados durante [place]. Dolor en la mueca que sube por el brazo hasta el hombro. Dolor que baja hasta la palma de la mano o los dedos. Sensacin de [employer]. Tal vez tenga dificultad para tomar y Personal assistant. Los  sntomas pueden empeorar durante la noche. Cmo se diagnostica? Esta afeccin se diagnostica mediante los antecedentes mdicos y un examen fsico. Tambin pueden hacerle estudios, que incluyen los siguientes: Un electromiograma (EMG). Esta prueba mide las seales elctricas que los nervios les envan a los msculos. Estudio de [employer]. Este estudio permite determinar si las seales elctricas pasan correctamente por los nervios. Estudios de diagnstico por imgenes, como radiografas, una ecografa y Hotel manager (RM). Estos estudios Education officer, community las posibles causas de la afeccin. Cmo se trata? El tratamiento de esta afeccin puede incluir: Cambios en el estilo de vida. Es importante que deje o cambie la actividad que caus la afeccin. Hacer ejercicio y actividades para fortalecer y Furniture conservator/restorer los msculos y los tendones (fisioterapia). Hacer cambios en el estilo de vida que lo ayuden con su afeccin y aprender a Education officer, environmental sus actividades diarias de forma segura (terapia ocupacional). Analgsicos y antiinflamatorios. Esto puede incluir medicamentos que se inyectan en la [place]. Una frula o un dispositivo ortopdico para la [place]. [place]. Siga estas instrucciones en su casa: Si tiene una frula o un dispositivo ortopdico: Use la frula o el dispositivo ortopdico como se lo haya indicado el mdico. Quteselos solamente como se lo haya indicado el mdico. Afloje la frula o el dispositivo ortopdico si los dedos de las manos se le adormecen, siente hormigueos o se le enfran y se tornan de Research officer, trade union. Mantenga la frula o el dispositivo ortopdico limpios. Si la frula o el dispositivo ortopdico no son impermeables: No deje que se mojen. Cbralos con un envoltorio hermtico cuando tome un bao de inmersin o una ducha. Control del dolor, la rigidez y la hinchazn Si se lo indican,  aplique hielo sobre la zona dolorida. Para hacer esto: Si tiene una frula o un  dispositivo ortopdico desmontable, quteselos como se lo haya indicado el mdico. Ponga el hielo en una bolsa plstica. Coloque una First[employer] Corp piel y la [place], o entre la frula o dispositivo ortopdico y Copy. Aplique el hielo durante 20 minutos, 2 o 3 veces por da. No se quede dormido con la bolsa de hielo [employer]. Retire el hielo si la piel se pone de color rojo brillante. Esto es Intel. Si no puede sentir dolor, calor o fro, tiene un mayor riesgo de que se dae la zona. Mueva los dedos con frecuencia para reducir la rigidez y la hinchazn. Instrucciones generales Use los medicamentos de venta libre y los recetados solamente como se lo haya indicado el mdico. Descanse la [place] y la mano de toda actividad que le cause dolor. Si la afeccin tiene relacin con Kathie Dike, hable con su empleador Tribune Company pueden [place], por [place], usar una almohadilla para apoyar la mueca mientras tipea. Haga los ejercicios como se lo hayan indicado el mdico, el fisioterapeuta o el terapeuta ocupacional. Cumpla con todas las visitas de seguimiento. Esto es importante. Comunquese con un mdico si: Aparecen nuevos sntomas. El dolor no se alivia con los [employer]. Sus sntomas empeoran. Solicite ayuda de inmediato si: Tiene hormigueo o adormecimiento intensos en la mueca o la mano. Resumen El sndrome del tnel carpiano es una afeccin que causa dolor, adormecimiento y debilidad en la mano y los dedos. Generalmente se debe a movimientos repetidos de [employer]. El sndrome del tnel carpiano se trata mediante cambios en el estilo de vida y medicamentos. Tambin puede indicarse la [place]. Siga las instrucciones del mdico sobre el uso de una frula, el descanso de la [place], la asistencia a las visitas de seguimiento y Freight forwarder para pedir ayuda. Esta informacin no tiene Theme park manager el consejo del mdico. Asegrese de hacerle al mdico cualquier pregunta  que tenga. Document Revised: 09/07/2019 Document Reviewed: 09/07/2019 Elsevier Patient Education  2022 Elsevier Inc. Gingivitis Gingivitis La gingivitis es el enrojecimiento, la irritacin y la hinchazn (inflamacin) de las encas. Esta afeccin generalmente es leve y desaparece con un tratamiento y [place] limpieza adecuada de los dientes. Si no se trata, la afeccin Financial risk analyst y Development worker, international aid otros problemas en los dientes y las encas. Cules son las causas? Grmenes (placa) que se acumulan en los dientes y las encas. Cuando la placa se acumula, se mezcla con la saliva de la boca para formar una sustancia dura llamada sarro o clculo, que queda atrapada en la base del diente. Esto irrita las encas y hace que se acumulen grmenes. Esto sucede por la falta de cuidado, y limpieza de la boca y de los dientes (higiene bucal). Qu incrementa el riesgo? No limpiarse y cuidarse correctamente la boca y los dientes. Seguir una dieta que no proporciona la nutricin [place]. Tomar ciertos medicamentos. Estar embarazada. Atravesar la pubertad o la menopausia. Usar productos que contienen tabaco. Tener ciertas afecciones, por ejemplo: Diabetes. Algunas infecciones. Sequedad de boca. Usar aparatos de ortodoncia que no estn bien ajustados. Cules son los signos o sntomas?  Encas que sangran fcilmente. Esto puede suceder al usar hilo dental o al [employer] Corporation dientes. Inflamacin de las encas. Encas de color rojo brillante o prpura. Encas retradas. Esto significa que las encas se separan de los dientes de tal manera que queda expuesta ms superficie del diente. Mal aliento. Cmo  se trata? Limpiar los dientes y las encas en el consultorio del dentista. El buen cuidado de la boca y de los Magazine features editor. Esto incluye un cepillado cuidadoso y [place] regular del hilo dental. Usar un enjuague bucal. En ciertos casos, se puede recetar o sugerir un enjuague bucal. Los casos ms graves  de esta afeccin pueden tratarse con antibiticos o [place]. Siga estas instrucciones en su casa:   Siga las instrucciones del dentista sobre cmo limpiarse los dientes. Asegrese de hacer lo siguiente: Cepllese los dientes con un cepillo dental de cerdas Honeywell al da. Use el hilo dental al menos una vez al da. Utilcelo antes de [employer]M [employer]. Use un enjuague bucal como se lo haya indicado el dentista. Siga una alimentacin bien equilibrada. Entre comidas, limite los alimentos y bebidas que Audiological scientist. Consulte peridicamente al dentista para un control y una limpieza. No fume ni consuma ningn producto que contenga nicotina o tabaco. Si necesita ayuda para dejar de fumar, consulte al mdico. Si le recetaron un antibitico, tmelo como se lo haya indicado el dentista. No deje de usarlo aunque comience a sentirse mejor. Concurra a todas las visitas de seguimiento. Comunquese con un mdico si: Tiene fiebre. Tiene mucho sangrado de las encas. Le duelen las encas o los dientes. Presenta dificultad para Product manager. Nota que algn diente est flojo o infectado. Las glndulas del cuello o el rostro estn inflamadas. Resumen La gingivitis es el enrojecimiento, la irritacin y la hinchazn (inflamacin) de las encas. Esta afeccin suele desaparecer con un tratamiento y [place] limpieza adecuada de los dientes. Cepllese los [employer] al da. Use el hilo dental al menos una vez al da. Concurra a todas las visitas de seguimiento. Esta informacin no tiene Theme park manager el consejo del mdico. Asegrese de hacerle al mdico cualquier pregunta que tenga. Document Revised: 09/10/2020 Document Reviewed: 09/10/2020 Elsevier Patient Education  2022 [employer].

## 2021-03-17 NOTE — Progress Notes (Signed)
Subjective:  Patient ID: [name], female    DOB: Jul 14, 1985  Age: 35 y.o. MRN: [id number]  CC: Knee Pain   HPI [name] is a 35 y.o. year old female who presents to establish care  Interval History: She complains of bleeding gums for the last 2 months and thinks she might have a cavity in one of her tooth. For the last 2 weeks she has noticed tingling and numbness  in her L arm especially when she lifts her hand to comb her hair. She is R handed. Endorses shaking her hand for relief in the mornings.  She has chronic left knee pain and is being followed by Ortho care with her last visit yesterday in preparation of surgical procedures being planned. Past Medical History:  Diagnosis Date   Depression    hx - no meds    Past Surgical History:  Procedure Laterality Date   CESAREAN SECTION   2004, 2010, 2013   x 3   CHOLECYSTECTOMY N/A 09/03/2016   Procedure: LAPAROSCOPIC CHOLECYSTECTOMY;  Surgeon: Greer Pickerel, MD;  Location: [facility];  Service: General;  Laterality: N/A;    Family History  Problem Relation Age of Onset   Hypertension Father    Diabetes Sister     Allergies  Allergen Reactions   Omeprazole Rash    Outpatient Medications Prior to Visit  Medication Sig Dispense Refill   famotidine (PEPCID) 20 MG tablet Take 1 tablet (20 mg total) by mouth 2 (two) times daily. To decrease stomach acid 60 tablet 3   loratadine (CLARITIN) 10 MG tablet Take 1 tablet (10 mg total) by mouth daily. 10 tablet 0   ondansetron (ZOFRAN-ODT) 4 MG disintegrating tablet Take 1 tablet (4 mg total) by mouth every 6 (six) hours as needed for nausea. 20 tablet 0   pantoprazole (PROTONIX) 40 MG tablet Take 1 tablet (40 mg total) by mouth daily. 30 tablet 2   senna (SENOKOT) 8.6 MG TABS tablet Take 1 tablet (8.6 mg total) by mouth 2 (two) times daily. 120 each 0   triamcinolone cream (KENALOG) 0.1 % Apply 1 application topically 2 (two) times daily. X 10 days for rash then use  as needed 30 g 3   No facility-administered medications prior to visit.     ROS Review of Systems  Constitutional:  Negative for activity change, appetite change and fatigue.  HENT:  Positive for dental problem. Negative for congestion, sinus pressure and sore throat.   Eyes:  Negative for visual disturbance.  Respiratory:  Negative for cough, chest tightness, shortness of breath and wheezing.   Cardiovascular:  Negative for chest pain and palpitations.  Gastrointestinal:  Negative for abdominal distention, abdominal pain and constipation.  Endocrine: Negative for polydipsia.  Genitourinary:  Negative for dysuria and frequency.  Musculoskeletal:        See HPI  Skin:  Negative for rash.  Neurological:  Positive for numbness. Negative for tremors and light-headedness.  Hematological:  Does not bruise/bleed easily.  Psychiatric/Behavioral:  Negative for agitation and behavioral problems.    Objective:  BP 106/72    Pulse 75    Ht _0  (1.499 m)    Wt 186 lb 12.8 oz (84.7 kg)    SpO2 99%    BMI 37.73 kg/m   BP/Weight 03/17/2021 01/27/2021 11/05/2120  Systolic BP 482 500 370  Diastolic BP 72 72 76  Wt. (Lbs) 186.8 180 189  BMI 37.73 37.62 38.17      Physical Exam Constitutional:  Appearance: She is well-developed.  HENT:     Mouth/Throat:     Mouth: Mucous membranes are moist.     Pharynx: Oropharynx is clear.  Cardiovascular:     Rate and Rhythm: Normal rate.     Heart sounds: Normal heart sounds. No murmur heard. Pulmonary:     Effort: Pulmonary effort is normal.     Breath sounds: Normal breath sounds. No wheezing or rales.  Chest:     Chest wall: No tenderness.  Abdominal:     General: Bowel sounds are normal. There is no distension.     Palpations: Abdomen is soft. There is no mass.     Tenderness: There is no abdominal tenderness.  Musculoskeletal:        General: Normal range of motion.     Right lower leg: No edema.     Left lower leg: No edema.      Comments: Positive Phalen sign on the left hand; negative Tinel signs bilaterally Normal handgrip bilaterally  Neurological:     Mental Status: She is alert and oriented to person, place, and time.  Psychiatric:        Mood and Affect: Mood normal.    CMP Latest Ref Rng & Units 08/02/2018 05/16/2018 09/05/2016  Glucose 65 - 99 mg/dL - 122(H) 88  BUN 6 - 20 mg/dL - 10 8  Creatinine 0.57 - 1.00 mg/dL - 0.52(L) 0.54  Sodium 134 - 144 mmol/L - 139 135  Potassium 3.5 - 5.2 mmol/L - 4.1 3.5  Chloride 96 - 106 mmol/L - 100 103  CO2 20 - 29 mmol/L - 21 26  Calcium 8.7 - 10.2 mg/dL - 9.5 8.1(L)  Total Protein 6.0 - 8.5 g/dL 7.4 7.2 -  Total Bilirubin 0.0 - 1.2 mg/dL <0.2 0.2 -  Alkaline Phos 39 - 117 IU/L 85 82 -  AST 0 - 40 IU/L 21 20 -  ALT 0 - 32 IU/L 24 17 -    Lipid Panel  No results found for: CHOL, TRIG, HDL, CHOLHDL, VLDL, LDLCALC, LDLDIRECT  CBC    Component Value Date/Time   WBC 9.5 09/05/2016 0356   RBC 3.25 (L) 09/05/2016 0356   HGB 9.3 (L) 09/05/2016 0356   HCT 28.8 (L) 09/05/2016 0356   PLT 382 09/05/2016 0356   MCV 88.6 09/05/2016 0356   MCH 28.6 09/05/2016 0356   MCHC 32.3 09/05/2016 0356   RDW 14.6 09/05/2016 0356   LYMPHSABS 2.3 09/02/2016 1842   MONOABS 1.0 09/02/2016 1842   EOSABS 0.2 09/02/2016 1842   BASOSABS 0.0 09/02/2016 1842    Lab Results  Component Value Date   HGBA1C 5.5 01/27/2021    Assessment & Plan:  1. Gingivitis, chronic Advised to gargle with mouthwash or warm saline solution - Ambulatory referral to Dentistry; Future  2. Carpal tunnel syndrome of left wrist Advised to obtain left wrist brace If symptoms persist despite treatment with short course of oral steroid will refer for glucocorticoid injection - predniSONE (DELTASONE) 20 MG tablet; Take 1 tablet (20 mg total) by mouth daily with breakfast.  Dispense: 5 tablet; Refill: 0  3. Need for hepatitis C screening test - HCV Ab w Reflex to Quant PCR; Future  4. Chronic pain of left  knee Management as per orthopedic  5. Screening for metabolic disorder - LP+Non-HDL Cholesterol; Future - CMP14+EGFR; Future - CBC with Differential/Platelet; Future - Hemoglobin A1c; Future    Meds ordered this encounter  Medications   predniSONE (DELTASONE) 20  MG tablet    Sig: Take 1 tablet (20 mg total) by mouth daily with breakfast.    Dispense:  5 tablet    Refill:  0    Follow-up: Return in about 6 weeks (around 04/28/2021) for Pap smear.       Charlott Rakes, MD, FAAFP. [facility] [place], [place]   03/17/2021, 2:26 PM

## 2021-03-17 NOTE — Progress Notes (Signed)
Left arm numbness when combing hair.

## 2021-03-18 ENCOUNTER — Encounter: Payer: Self-pay | Admitting: Orthopedic Surgery

## 2021-03-18 NOTE — Progress Notes (Signed)
Office Visit Note   Patient: [name]           Date of Birth: September 04, 1985           MRN: [id number] Visit Date: 03/16/2021 Requested by: No referring provider defined for this encounter. PCP: Cain Saupe, MD (Inactive)  Subjective: Chief Complaint  Patient presents with   Other     Scan review    HPI: Patient presents for follow-up of left knee medial pain and swelling.  Naproxen helps with pain.  She works in [employer].  Has some difficulty with flexion.  MRI scan is reviewed and does show ganglion cyst emanating around the posterior medial aspect of the medial femoral condyle and moving anteriorly.  Menisci and ligaments otherwise intact.  The cyst measures 5 x 3 and half by 3-1/2 cm.  Nothing Malignant about it.              ROS: All systems reviewed are negative as they relate to the chief complaint within the history of present illness.  Patient denies  fevers or chills.   Assessment & Plan: Visit Diagnoses:  1. Left knee pain, unspecified chronicity     Plan: Impression is left knee ganglion cyst which is likely interfering with her flexion.  Through a translator we discussed all options including surgery aspiration and observation.  After discussing the price of some of these interventions she is going to observe for now.  I do not think this is really a major structural problem but just is likely uncomfortable with flexion.  I think aspiration under ultrasound guidance would be a pretty good option to decompress the cyst that she wants to wait this out little bit longer.  Follow-up with [place] as needed.  Follow-Up Instructions: No follow-ups on file.   Orders:  Orders Placed This Encounter  Procedures   US Guided Needle Placement - No Linked Charges   No orders of the defined types were placed in this encounter.     Procedures: No procedures performed   Clinical Data: No additional findings.  Objective: Vital Signs: There were no vitals taken for  this visit.  Physical Exam:   Constitutional: Patient appears well-developed HEENT:  Head: Normocephalic Eyes:EOM are normal Neck: Normal range of motion Cardiovascular: Normal rate Pulmonary/chest: Effort normal Neurologic: Patient is alert Skin: Skin is warm Psychiatric: Patient has normal mood and affect   Ortho Exam: Ortho exam demonstrates full active and passive range of motion of the knee.  No effusion.  Collateral crucial ligaments are stable.  Does have some medial sided tenderness in that left knee but no discrete palpable masses present.  Soft tissue envelope around the knee is moderate.  Specialty Comments:  No specialty comments available.  Imaging: No results found.   PMFS History: Patient Active Problem List   Diagnosis Date Noted   Chronic pain of left knee 01/29/2021   Other constipation 01/29/2021   Class 2 obesity due to excess calories without serious comorbidity with body mass index (BMI) of 37.0 to 37.9 in adult 01/29/2021   Acute calculous cholecystitis 09/02/2016   Pregnancy complicated by IUD (intrauterine device) 10/16/2010   Past Medical History:  Diagnosis Date   Depression    hx - no meds    Family History  Problem Relation Age of Onset   Hypertension Father    Diabetes Sister     Past Surgical History:  Procedure Laterality Date   CESAREAN SECTION   2004, 2010, 2013  x 3   CHOLECYSTECTOMY N/A 09/03/2016   Procedure: LAPAROSCOPIC CHOLECYSTECTOMY;  Surgeon: Gaynelle Adu, MD;  Location: [facility] OR;  Service: General;  Laterality: N/A;   Social History   Occupational History   Not on file  Tobacco Use   Smoking status: Former    Types: Cigars    Quit date: 06/04/2010    Years since quitting: 10.7   Smokeless tobacco: Never  Vaping Use   Vaping Use: Never used  Substance and Sexual Activity   Alcohol use: Yes    Alcohol/week: 1.0 standard drink    Types: 1 Cans of beer per week    Comment: socially   Drug use: No   Sexual activity:  Yes    Birth control/protection: None

## 2021-03-24 ENCOUNTER — Ambulatory Visit: Payer: No Typology Code available for payment source | Attending: Family Medicine

## 2021-03-24 ENCOUNTER — Other Ambulatory Visit: Payer: Self-pay

## 2021-03-24 DIAGNOSIS — Z1159 Encounter for screening for other viral diseases: Secondary | ICD-10-CM

## 2021-03-24 DIAGNOSIS — Z13228 Encounter for screening for other metabolic disorders: Secondary | ICD-10-CM

## 2021-03-25 LAB — LP+NON-HDL CHOLESTEROL
Cholesterol, Total: 226 mg/dL — ABNORMAL HIGH (ref 100–199)
HDL: 65 mg/dL (ref >39)
LDL Chol Calc (NIH): 141 mg/dL — ABNORMAL HIGH (ref 0–99)
Total Non-HDL-Chol (LDL+VLDL): 161 mg/dL — ABNORMAL HIGH (ref 0–129)
Triglycerides: 113 mg/dL (ref 0–149)
VLDL Cholesterol Cal: 20 mg/dL (ref 5–40)

## 2021-03-25 LAB — CBC WITH DIFFERENTIAL/PLATELET
Basophils Absolute: 0 10*3/uL (ref 0.0–0.2)
Basos: 0 %
EOS (ABSOLUTE): 0.2 10*3/uL (ref 0.0–0.4)
Eos: 2 %
Hematocrit: 40.5 % (ref 34.0–46.6)
Hemoglobin: 13.3 g/dL (ref 11.1–15.9)
Immature Grans (Abs): 0 10*3/uL (ref 0.0–0.1)
Immature Granulocytes: 0 %
Lymphocytes Absolute: 2.6 10*3/uL (ref 0.7–3.1)
Lymphs: 32 %
MCH: 29.1 pg (ref 26.6–33.0)
MCHC: 32.8 g/dL (ref 31.5–35.7)
MCV: 89 fL (ref 79–97)
Monocytes Absolute: 0.5 10*3/uL (ref 0.1–0.9)
Monocytes: 6 %
Neutrophils Absolute: 4.9 10*3/uL (ref 1.4–7.0)
Neutrophils: 60 %
Platelets: 321 10*3/uL (ref 150–450)
RBC: 4.57 x10E6/uL (ref 3.77–5.28)
RDW: 12.9 % (ref 11.7–15.4)
WBC: 8.2 10*3/uL (ref 3.4–10.8)

## 2021-03-25 LAB — CMP14+EGFR
ALT: 17 IU/L (ref 0–32)
AST: 18 IU/L (ref 0–40)
Albumin/Globulin Ratio: 1.9 (ref 1.2–2.2)
Albumin: 5 g/dL — ABNORMAL HIGH (ref 3.8–4.8)
Alkaline Phosphatase: 84 IU/L (ref 44–121)
BUN/Creatinine Ratio: 21 (ref 9–23)
BUN: 12 mg/dL (ref 6–20)
Bilirubin Total: 0.3 mg/dL (ref 0.0–1.2)
CO2: 23 mmol/L (ref 20–29)
Calcium: 9.5 mg/dL (ref 8.7–10.2)
Chloride: 104 mmol/L (ref 96–106)
Creatinine, Ser: 0.58 mg/dL (ref 0.57–1.00)
Globulin, Total: 2.6 g/dL (ref 1.5–4.5)
Glucose: 92 mg/dL (ref 70–99)
Potassium: 4.3 mmol/L (ref 3.5–5.2)
Sodium: 141 mmol/L (ref 134–144)
Total Protein: 7.6 g/dL (ref 6.0–8.5)
eGFR: 119 mL/min/{1.73_m2} (ref >59)

## 2021-03-25 LAB — HCV INTERPRETATION

## 2021-03-25 LAB — HCV AB W REFLEX TO QUANT PCR: HCV Ab: <0.1 s/co ratio (ref 0.0–0.9)

## 2021-03-25 LAB — HEMOGLOBIN A1C
Est. average glucose Bld gHb Est-mCnc: 120 mg/dL
Hgb A1c MFr Bld: 5.8 % — ABNORMAL HIGH (ref 4.8–5.6)

## 2021-04-20 ENCOUNTER — Ambulatory Visit (INDEPENDENT_AMBULATORY_CARE_PROVIDER_SITE_OTHER): Payer: Self-pay | Admitting: Orthopedic Surgery

## 2021-04-20 ENCOUNTER — Other Ambulatory Visit: Payer: Self-pay

## 2021-04-20 ENCOUNTER — Ambulatory Visit: Payer: Self-pay

## 2021-04-20 DIAGNOSIS — M25862 Other specified joint disorders, left knee: Secondary | ICD-10-CM

## 2021-04-20 DIAGNOSIS — M25562 Pain in left knee: Secondary | ICD-10-CM

## 2021-04-20 NOTE — Progress Notes (Signed)
° °  Procedure Note  Patient: [name]             Date of Birth: Nov 03, 1985           MRN: [id number]             Visit Date: 04/20/2021 Shirlee Limerick is a 36 year old female with left knee pain.  She has a known left knee cyst discovered on MRI scan from last clinic visit.  She does do physical type work.  She considered aspiration and injection of the cyst last clinic visit but decided against it.  She presents now for further evaluation and likely aspiration and injection of the cyst which is symptomatic for her.  All systems reviewed are negative as they relate to the chief complaint within the history of present illness.  Patient denies  fevers or chills.   Impression is left knee cyst.  Ultrasound-guided aspiration of the cyst is performed today with extraction of about 10 cc of gelatinous fluid.  Did inject Marcaine cortisone into that cystic region to diminish chance of recurrence.  She will follow-up as needed but surgical excision would be consideration as a neck step.    Constitutional: Patient appears well-developed HEENT:  Head: Normocephalic Eyes:EOM are normal Neck: Normal range of motion Cardiovascular: Normal rate Pulmonary/chest: Effort normal Neurologic: Patient is alert Skin: Skin is warm Psychiatric: Patient has normal mood and affect   Ortho exam demonstrates full range of motion of that left knee.  No effusion in the knee itself.  She does have pain to palpation along the medial aspect of the knee.  Collateral and cruciate ligaments are stable.  Ultrasound examination does demonstrate sizable cystic structure along the medial aspect of the knee. Procedures: Visit Diagnoses: No diagnosis found.  Large Joint Inj on 04/20/2021 8:55 AM Indications: diagnostic evaluation, joint swelling and pain Details: 18 G 1.5 in needle, ultrasound-guided superolateral approach  Arthrogram: No  Medications: 5 mL lidocaine 1 %; 40 mg methylPREDNISolone acetate 40 MG/ML; 4 mL  bupivacaine 0.25 % Outcome: tolerated well, no immediate complications Procedure, treatment alternatives, risks and benefits explained, specific risks discussed. Consent was given by the patient. Immediately prior to procedure a time out was called to verify the correct patient, procedure, equipment, support staff and site/side marked as required. Patient was prepped and draped in the usual sterile fashion.    10 cc of gelatinous cystic fluid aspirated from the cyst.

## 2021-04-22 ENCOUNTER — Encounter: Payer: Self-pay | Admitting: Orthopedic Surgery

## 2021-04-22 MED ORDER — METHYLPREDNISOLONE ACETATE 40 MG/ML IJ SUSP
40.0000 mg | INTRAMUSCULAR | Status: AC | PRN
  Administered 2021-04-20: 40 mg via INTRA_ARTICULAR

## 2021-04-22 MED ORDER — BUPIVACAINE HCL 0.25 % IJ SOLN
4.0000 mL | INTRAMUSCULAR | Status: AC | PRN
  Administered 2021-04-20: 4 mL via INTRA_ARTICULAR

## 2021-04-22 MED ORDER — LIDOCAINE HCL 1 % IJ SOLN
5.0000 mL | INTRAMUSCULAR | Status: AC | PRN
  Administered 2021-04-20: 5 mL

## 2021-05-01 ENCOUNTER — Encounter: Payer: Self-pay | Admitting: Surgical

## 2021-05-01 ENCOUNTER — Other Ambulatory Visit: Payer: Self-pay

## 2021-05-01 ENCOUNTER — Ambulatory Visit (INDEPENDENT_AMBULATORY_CARE_PROVIDER_SITE_OTHER): Payer: Self-pay | Admitting: Surgical

## 2021-05-01 DIAGNOSIS — M25862 Other specified joint disorders, left knee: Secondary | ICD-10-CM

## 2021-05-01 NOTE — Progress Notes (Signed)
Office Visit Note   Patient: [name]           Date of Birth: 03-Sep-1985           MRN: [id number] Visit Date: 05/01/2021 Requested by: Hoy Register, MD [address] [place],  [place] [address] PCP: Hoy Register, MD  Subjective: Chief Complaint  Patient presents with   Left Knee - Pain    HPI: [name] is a 36 y.o. female who presents to the office complaining of medial left knee pain.  Patient has continued left knee pain following aspiration and injection of left knee cystic structure on 04/20/2021.  She states that she has about 30% relief from the aspiration/injection but has continued pain with walking, standing, working.  She works as a Financial risk analyst.  Does not prevent her from working but does make it somewhat miserable for her.  She works through the pain as she has bills to pay.  Denies any lateral pain.  Denies any radicular pain or groin pain..                ROS: All systems reviewed are negative as they relate to the chief complaint within the history of present illness.  Patient denies fevers or chills.  Assessment & Plan: Visit Diagnoses:  1. Cyst of left knee joint     Plan: Patient is a 36 year old female who presents for evaluation of left knee pain.  She has continued pain with some mild improvement from recent aspiration and injection of the cystic structure near the medial femoral condyle.  Discussed the options available to patient including living with her pain, repeat aspiration and injection in the future, surgical excision.  Do not think that another aspiration and injection would necessarily be beneficial for her since she did not really have much relief even in the first couple weeks since the last injection on 04/20/2021.  Discussed surgical excision and the risks of the surgery including the risk of nerve/vessel damage, knee stiffness, infection, medical complication from surgery, DVT/PE, recurrence of the cyst.  Estimate about 5% chance  of cystic recurrence.  Also discussed the recovery timeframe following procedure.  After discussion, patient would like to try different anti-inflammatory medication and she will discuss surgery with her family.  Debbie's card was given to her today and she will call the office if she would like to proceed.  Follow-Up Instructions: No follow-ups on file.   Orders:  No orders of the defined types were placed in this encounter.  No orders of the defined types were placed in this encounter.     Procedures: No procedures performed   Clinical Data: No additional findings.  Objective: Vital Signs: There were no vitals taken for this visit.  Physical Exam:  Constitutional: Patient appears well-developed HEENT:  Head: Normocephalic Eyes:EOM are normal Neck: Normal range of motion Cardiovascular: Normal rate Pulmonary/chest: Effort normal Neurologic: Patient is alert Skin: Skin is warm Psychiatric: Patient has normal mood and affect  Ortho Exam: Ortho exam demonstrates subtle palpable cyst of the medial knee near the medial femoral condyle.  No calf tenderness.  Negative Homans' sign.  No tenderness over the medial joint line.  No tenderness over the lateral joint line.  Able to perform straight leg raise without difficulty.  Range of motion from 0 degrees to greater than 115 degrees of knee flexion.  No pain with hip range of motion.  No effusion noted.  Specialty Comments:  No specialty comments available.  Imaging: No  results found.   PMFS History: Patient Active Problem List   Diagnosis Date Noted   Chronic pain of left knee 01/29/2021   Other constipation 01/29/2021   Class 2 obesity due to excess calories without serious comorbidity with body mass index (BMI) of 37.0 to 37.9 in adult 01/29/2021   Acute calculous cholecystitis 09/02/2016   Pregnancy complicated by IUD (intrauterine device) 10/16/2010   Past Medical History:  Diagnosis Date   Depression    hx - no  meds    Family History  Problem Relation Age of Onset   Hypertension Father    Diabetes Sister     Past Surgical History:  Procedure Laterality Date   CESAREAN SECTION   2004, 2010, 2013   x 3   CHOLECYSTECTOMY N/A 09/03/2016   Procedure: LAPAROSCOPIC CHOLECYSTECTOMY;  Surgeon: Gaynelle Adu, MD;  Location: [facility] OR;  Service: General;  Laterality: N/A;   Social History   Occupational History   Not on file  Tobacco Use   Smoking status: Former    Types: Cigars    Quit date: 06/04/2010    Years since quitting: 10.9   Smokeless tobacco: Never  Vaping Use   Vaping Use: Never used  Substance and Sexual Activity   Alcohol use: Yes    Alcohol/week: 1.0 standard drink    Types: 1 Cans of beer per week    Comment: socially   Drug use: No   Sexual activity: Yes    Birth control/protection: None

## 2021-05-11 ENCOUNTER — Telehealth: Payer: Self-pay | Admitting: Surgical

## 2021-05-11 NOTE — Telephone Encounter (Signed)
Received call from Heather-pharmacist with [facility] on [facility]. She advised the Rx Celebrex is to expensive for the patient and asked if Franky Macho can change to the generic form for the patient. The number to contact Herbert Seta is [phone].

## 2021-05-18 ENCOUNTER — Other Ambulatory Visit: Payer: Self-pay | Admitting: Surgical

## 2021-05-18 ENCOUNTER — Other Ambulatory Visit: Payer: Self-pay

## 2021-05-18 MED ORDER — DICLOFENAC SODIUM 75 MG PO TBEC
75.0000 mg | DELAYED_RELEASE_TABLET | Freq: Two times a day (BID) | ORAL | 2 refills | Status: DC
  Filled 2021-05-18: qty 60, 30d supply, fill #0

## 2021-05-18 NOTE — Telephone Encounter (Signed)
I called pharmacy. LMVM advising per [name]

## 2021-05-18 NOTE — Telephone Encounter (Signed)
I did not see a generic prescription for Celebrex available in the chart so I prescribed generic diclofenac instead.  Hopefully this helps her.

## 2021-05-25 ENCOUNTER — Ambulatory Visit: Payer: Self-pay | Attending: Family Medicine | Admitting: Family Medicine

## 2021-05-25 ENCOUNTER — Other Ambulatory Visit: Payer: Self-pay

## 2021-05-25 ENCOUNTER — Other Ambulatory Visit (HOSPITAL_COMMUNITY)
Admission: RE | Admit: 2021-05-25 | Discharge: 2021-05-25 | Disposition: A | Discharge status: Home / Self Care | Payer: Self-pay | Source: Ambulatory Visit | Attending: Family Medicine | Admitting: Family Medicine

## 2021-05-25 ENCOUNTER — Encounter: Payer: Self-pay | Admitting: Family Medicine

## 2021-05-25 VITALS — BP 114/75 | HR 87 | Ht 59.0 in | Wt 184.6 lb

## 2021-05-25 DIAGNOSIS — Z124 Encounter for screening for malignant neoplasm of cervix: Secondary | ICD-10-CM

## 2021-05-25 DIAGNOSIS — E7800 Pure hypercholesterolemia, unspecified: Secondary | ICD-10-CM

## 2021-05-25 DIAGNOSIS — Z6837 Body mass index (BMI) 37.0-37.9, adult: Secondary | ICD-10-CM

## 2021-05-25 DIAGNOSIS — R7303 Prediabetes: Secondary | ICD-10-CM

## 2021-05-25 DIAGNOSIS — Z113 Encounter for screening for infections with a predominantly sexual mode of transmission: Secondary | ICD-10-CM

## 2021-05-25 DIAGNOSIS — E6609 Other obesity due to excess calories: Secondary | ICD-10-CM

## 2021-05-25 DIAGNOSIS — E785 Hyperlipidemia, unspecified: Secondary | ICD-10-CM | POA: Insufficient documentation

## 2021-05-25 NOTE — Patient Instructions (Signed)
Sent Referral to [facility] ph. # [phone] [address] [place], [place] [address]       

## 2021-05-25 NOTE — Progress Notes (Signed)
Subjective:  Patient ID: [name], female    DOB: 1985-11-28  Age: 36 y.o. MRN: [id number]  CC: Gynecologic Exam   HPI [name] is a 36 y.o. year old female with a history of history of obesity who presents today for gynecological exam. Her last set of labs revealed prediabetes with A1c of 5.8 and hyperlipidemia. Due to her low cardiovascular risk decision was made to work on lifestyle modifications  Interval History: She is requesting I go over her labs with her again today due to further she did not totally comprehend the MyChart message I sent to her.  Labs have been reviewed with her and necessary lifestyle modifications needed. She is due for Pap smear today and would also like STD test. Denies history of breast cancer. Past Medical History:  Diagnosis Date   Depression    hx - no meds    Past Surgical History:  Procedure Laterality Date   CESAREAN SECTION   2004, 2010, 2013   x 3   CHOLECYSTECTOMY N/A 09/03/2016   Procedure: LAPAROSCOPIC CHOLECYSTECTOMY;  Surgeon: Greer Pickerel, MD;  Location: [facility];  Service: General;  Laterality: N/A;    Family History  Problem Relation Age of Onset   Hypertension Father    Diabetes Sister     Allergies  Allergen Reactions   Omeprazole Rash    Outpatient Medications Prior to Visit  Medication Sig Dispense Refill   diclofenac (VOLTAREN) 75 MG EC tablet Take 1 tablet (75 mg total) by mouth 2 (two) times daily. 60 tablet 2   famotidine (PEPCID) 20 MG tablet Take 1 tablet (20 mg total) by mouth 2 (two) times daily. To decrease stomach acid 60 tablet 3   loratadine (CLARITIN) 10 MG tablet Take 1 tablet (10 mg total) by mouth daily. 10 tablet 0   ondansetron (ZOFRAN-ODT) 4 MG disintegrating tablet Take 1 tablet (4 mg total) by mouth every 6 (six) hours as needed for nausea. 20 tablet 0   pantoprazole (PROTONIX) 40 MG tablet Take 1 tablet (40 mg total) by mouth daily. 30 tablet 2   predniSONE (DELTASONE) 20 MG  tablet Take 1 tablet (20 mg total) by mouth daily with breakfast. 5 tablet 0   senna (SENOKOT) 8.6 MG TABS tablet Take 1 tablet (8.6 mg total) by mouth 2 (two) times daily. 120 each 0   triamcinolone cream (KENALOG) 0.1 % Apply 1 application topically 2 (two) times daily. X 10 days for rash then use as needed 30 g 3   No facility-administered medications prior to visit.     ROS Review of Systems  Constitutional:  Negative for activity change, appetite change and fatigue.  HENT:  Negative for congestion, sinus pressure and sore throat.   Eyes:  Negative for visual disturbance.  Respiratory:  Negative for cough, chest tightness, shortness of breath and wheezing.   Cardiovascular:  Negative for chest pain and palpitations.  Gastrointestinal:  Negative for abdominal distention, abdominal pain and constipation.  Endocrine: Negative for polydipsia.  Genitourinary:  Negative for dysuria and frequency.  Musculoskeletal:  Negative for arthralgias and back pain.  Skin:  Negative for rash.  Neurological:  Negative for tremors, light-headedness and numbness.  Hematological:  Does not bruise/bleed easily.  Psychiatric/Behavioral:  Negative for agitation and behavioral problems.    Objective:  BP 114/75    Pulse 87    Ht 4\' 11"  (1.499 m)    Wt 184 lb 9.6 oz (83.7 kg)    SpO2 98%  BMI 37.28 kg/m   BP/Weight 05/25/2021 03/17/2021 [id number]  Systolic BP 99991111 [id number]  Diastolic BP 75 72 72  Wt. (Lbs) 184.6 186.8 180  BMI 37.28 37.73 37.62      Physical Exam Constitutional:      Appearance: She is well-developed. She is obese.  Cardiovascular:     Rate and Rhythm: Normal rate.     Heart sounds: Normal heart sounds. No murmur heard. Pulmonary:     Effort: Pulmonary effort is normal.     Breath sounds: Normal breath sounds. No wheezing or rales.  Chest:     Chest wall: No tenderness.  Abdominal:     General: Bowel sounds are normal. There is no distension.     Palpations: Abdomen is  soft. There is no mass.     Tenderness: There is no abdominal tenderness.  Genitourinary:    Comments: External genitalia, vagina, cervix, adnexa-normal Musculoskeletal:        General: Normal range of motion.     Right lower leg: No edema.     Left lower leg: No edema.  Neurological:     Mental Status: She is alert and oriented to person, place, and time.  Psychiatric:        Mood and Affect: Mood normal.    CMP Latest Ref Rng & Units 03/24/2021 08/02/2018 05/16/2018  Glucose 70 - 99 mg/dL 92 - 122(H)  BUN 6 - 20 mg/dL 12 - 10  Creatinine 0.57 - 1.00 mg/dL 0.58 - 0.52(L)  Sodium 134 - 144 mmol/L 141 - 139  Potassium 3.5 - 5.2 mmol/L 4.3 - 4.1  Chloride 96 - 106 mmol/L 104 - 100  CO2 20 - 29 mmol/L 23 - 21  Calcium 8.7 - 10.2 mg/dL 9.5 - 9.5  Total Protein 6.0 - 8.5 g/dL 7.6 7.4 7.2  Total Bilirubin 0.0 - 1.2 mg/dL 0.3 <0.2 0.2  Alkaline Phos 44 - 121 IU/L 84 85 82  AST 0 - 40 IU/L 18 21 20   ALT 0 - 32 IU/L 17 24 17     Lipid Panel     Component Value Date/Time   CHOL 226 (H) 03/24/2021 1349   TRIG 113 03/24/2021 1349   HDL 65 03/24/2021 1349   LDLCALC 141 (H) 03/24/2021 1349    CBC    Component Value Date/Time   WBC 8.2 03/24/2021 1349   WBC 9.5 09/05/2016 0356   RBC 4.57 03/24/2021 1349   RBC 3.25 (L) 09/05/2016 0356   HGB 13.3 03/24/2021 1349   HCT 40.5 03/24/2021 1349   PLT 321 03/24/2021 1349   MCV 89 03/24/2021 1349   MCH 29.1 03/24/2021 1349   MCH 28.6 09/05/2016 0356   MCHC 32.8 03/24/2021 1349   MCHC 32.3 09/05/2016 0356   RDW 12.9 03/24/2021 1349   LYMPHSABS 2.6 03/24/2021 1349   MONOABS 1.0 09/02/2016 1842   EOSABS 0.2 03/24/2021 1349   BASOSABS 0.0 03/24/2021 1349    Lab Results  Component Value Date   HGBA1C 5.8 (H) 03/24/2021    Assessment & Plan:  1. Prediabetes Labs reveal prediabetes with an A1c of 5.8.  Working on a low carbohydrate diet, exercise, weight loss recommended in order to prevent progression to type 2 diabetes  mellitus.   2. Screening for STD (sexually transmitted disease) - Cervicovaginal ancillary only  3. Pure hypercholesterolemia Low cardiovascular risk Lifestyle modification We will hold off on statin  4. Screening for cervical cancer - Cytology - PAP  5. Class 2 obesity  due to excess calories without serious comorbidity with body mass index (BMI) of 37.0 to 37.9 in adult Counseled on 150 minutes of exercise per week, healthy eating (including decreased daily intake of saturated fats, cholesterol, added sugars, sodium).     No orders of the defined types were placed in this encounter.   Follow-up: Return in about 1 year (around 05/25/2022) for Complete physical exam.       Charlott Rakes, MD, FAAFP. [facility] [place], [place]   05/25/2021, 2:07 PM

## 2021-05-26 LAB — CERVICOVAGINAL ANCILLARY ONLY
Bacterial Vaginitis (gardnerella): NEGATIVE
Candida Glabrata: NEGATIVE
Candida Vaginitis: NEGATIVE
Chlamydia: NEGATIVE
Comment: NEGATIVE
Comment: NEGATIVE
Comment: NEGATIVE
Comment: NEGATIVE
Comment: NEGATIVE
Comment: NORMAL
Neisseria Gonorrhea: NEGATIVE
Trichomonas: NEGATIVE

## 2021-05-27 LAB — CYTOLOGY - PAP
Adequacy: ABSENT
Comment: NEGATIVE
Diagnosis: NEGATIVE
High risk HPV: NEGATIVE

## 2021-08-11 ENCOUNTER — Ambulatory Visit: Payer: Self-pay | Admitting: *Deleted

## 2021-08-11 NOTE — Telephone Encounter (Signed)
Called patient via interpreter Fabio Bering ID# [phone]. ? ?Chief Complaint: eyes red itching and swollen  ?Symptoms: bilateral eyes swollen on top and bottom of eyelids right worse than left. Wakes up in am with yellow drainage and unable to open eyes ,stuck together. Patient has been trying OTC medications and now are not working  ?Frequency: 2 months but worse in the past 2 weeks  ?Pertinent Negatives: Patient denies fever, unable to see  ?Disposition: [x] ED /[] Urgent Care (no appt availability in office) / [] Appointment(In office/virtual)/ []  [facility]/ [] Home Care/ [x] Refused Recommended Disposition /[] [facility]/ []  Follow-up with PCP ?Additional Notes:  ? ?Patient would like to see PCP only. No available appt until 11/02/21. Recommended ED and patient reports too expensive. Requesting if eye drops can be prescribed. Please advise if earlier appt can be established.  ? ? Reason for Disposition ? [1] SEVERE eyelid swelling (i.e., shut or almost) AND [2] involves both eyes AND [3] itchy ? ?Answer Assessment - Initial Assessment Questions ?1. ONSET: "When did the swelling start?" (e.g., minutes, hours, days) ?    2 weeks worsening from 2 months ago  ?2. LOCATION: "What part of the eyelids is swollen?" ?    Eyelids and under eyes  ?3. SEVERITY: "How swollen is it?" ?    Swollen  ?4. ITCHING: "Is there any itching?" If Yes, ask: "How much?"   (Scale 1-10; mild, moderate or severe) ?    Yes   ?5. PAIN: "Is the swelling painful to touch?" If Yes, ask: "How painful is it?"   (Scale 1-10; mild, moderate or severe) ?    Not a lot  ?6. FEVER: "Do you have a fever?" If Yes, ask: "What is it, how was it measured, and when did it start?"  ?    No  ?7. CAUSE: "What do you think is causing the swelling?" ?    Not sure may be pollen ?8. RECURRENT SYMPTOM: "Have you had eyelid swelling before?" If Yes, ask: "When was the last time?" "What happened that time?" ?    no ?9. OTHER SYMPTOMS: "Do you have any  other symptoms?" (e.g., blurred vision, eye discharge, rash, runny nose) ?    Yellow drainage from right eye in am and swollen, runny nose  ?10. PREGNANCY: "Is there any chance you are pregnant?" "When was your last menstrual period?" ?      na ? ?Protocols used: Eye - Swelling-A-AH ? ?

## 2021-08-13 NOTE — Telephone Encounter (Signed)
[place] interpreter: Nicolos ?ID: [id number] ?  ?Pt has been contacted and has been scheduled a sooner appt for May 15 at 910am  ?  ?

## 2021-08-17 ENCOUNTER — Ambulatory Visit: Payer: Self-pay | Attending: Internal Medicine | Admitting: Internal Medicine

## 2021-08-17 ENCOUNTER — Other Ambulatory Visit: Payer: Self-pay

## 2021-08-17 VITALS — BP 108/72 | HR 97 | Temp 98.7°F | Resp 16 | Ht 59.0 in | Wt 183.0 lb

## 2021-08-17 DIAGNOSIS — H1013 Acute atopic conjunctivitis, bilateral: Secondary | ICD-10-CM

## 2021-08-17 DIAGNOSIS — L309 Dermatitis, unspecified: Secondary | ICD-10-CM

## 2021-08-17 MED ORDER — TRIAMCINOLONE ACETONIDE 0.1 % EX CREA
1.0000 "application " | TOPICAL_CREAM | Freq: Two times a day (BID) | CUTANEOUS | 0 refills | Status: DC
  Filled 2021-08-17: qty 30, 15d supply, fill #0

## 2021-08-17 MED ORDER — TRIAMCINOLONE ACETONIDE 0.1 % EX CREA
1.0000 | TOPICAL_CREAM | Freq: Two times a day (BID) | CUTANEOUS | 0 refills | Status: DC
Start: 2021-08-17 — End: 2022-05-25
  Filled 2021-08-17: qty 30, 15d supply, fill #0

## 2021-08-17 MED ORDER — OLOPATADINE HCL 0.1 % OP SOLN
1.0000 [drp] | Freq: Two times a day (BID) | OPHTHALMIC | 1 refills | Status: DC | PRN
  Filled 2021-08-17: qty 5, 20d supply, fill #0
  Filled 2021-11-06: qty 5, 20d supply, fill #1

## 2021-08-17 NOTE — Progress Notes (Signed)
? ? ?Patient ID: [name], female    DOB: 04/04/1986  MRN: [id number] ? ?CC: Allergies ? ? ?Subjective: ?[name] is a 36 y.o. female who presents for UC visit.  PCP is Dr. Alvis LemmingsNewlin.  Pt speaks english and decline interpreter. ?Her concerns today include:  ?Hx of PreDM, HL, obesity ? ?Pt c/o itching, red eyes RT>LT.  +Rhinorrhea.  Waking up in the a.ms with white crusting of the eye lids. Sometimes she gets swelling of RT eye lids.  Symptoms x 2 mths ?No sneezing or sinus congestion.  No itchy throat. ?Wears eye shadow but same for a while.  Use to apply false eyelashes but not any more. Does not wear contacts. ?Uses OTC allergy eye drops but they do not help ? ?Also requesting refill on triamcinolone cream which she states was prescribed to her a few years ago for intermittent rash that she gets on her hand. ?Patient Active Problem List  ? Diagnosis Date Noted  ? Hyperlipidemia 05/25/2021  ? Prediabetes 05/25/2021  ? Chronic pain of left knee 01/29/2021  ? Other constipation 01/29/2021  ? Class 2 obesity due to excess calories without serious comorbidity with body mass index (BMI) of 37.0 to 37.9 in adult 01/29/2021  ? Acute calculous cholecystitis 09/02/2016  ? Pregnancy complicated by IUD (intrauterine device) 10/16/2010  ?  ? ?Current Outpatient Medications on File Prior to Visit  ?Medication Sig Dispense Refill  ? diclofenac (VOLTAREN) 75 MG EC tablet Take 1 tablet (75 mg total) by mouth 2 (two) times daily. 60 tablet 2  ? famotidine (PEPCID) 20 MG tablet Take 1 tablet (20 mg total) by mouth 2 (two) times daily. To decrease stomach acid 60 tablet 3  ? loratadine (CLARITIN) 10 MG tablet Take 1 tablet (10 mg total) by mouth daily. 10 tablet 0  ? ondansetron (ZOFRAN-ODT) 4 MG disintegrating tablet Take 1 tablet (4 mg total) by mouth every 6 (six) hours as needed for nausea. 20 tablet 0  ? pantoprazole (PROTONIX) 40 MG tablet Take 1 tablet (40 mg total) by mouth daily. 30 tablet 2  ? senna  (SENOKOT) 8.6 MG TABS tablet Take 1 tablet (8.6 mg total) by mouth 2 (two) times daily. 120 each 0  ? triamcinolone cream (KENALOG) 0.1 % Apply 1 application topically 2 (two) times daily. X 10 days for rash then use as needed 30 g 3  ? ?No current facility-administered medications on file prior to visit.  ? ? ?Allergies  ?Allergen Reactions  ? Omeprazole Rash  ? ? ?Social History  ? ?Socioeconomic History  ? Marital status: Single  ?  Spouse name: Not on file  ? Number of children: Not on file  ? Years of education: Not on file  ? Highest education level: Not on file  ?Occupational History  ? Not on file  ?Tobacco Use  ? Smoking status: Former  ?  Types: Cigars  ?  Quit date: 06/04/2010  ?  Years since quitting: 11.2  ? Smokeless tobacco: Never  ?Vaping Use  ? Vaping Use: Never used  ?Substance and Sexual Activity  ? Alcohol use: Yes  ?  Alcohol/week: 1.0 standard drink  ?  Types: 1 Cans of beer per week  ?  Comment: socially  ? Drug use: No  ? Sexual activity: Yes  ?  Birth control/protection: None  ?Other Topics Concern  ? Not on file  ?Social History Narrative  ? Not on file  ? ?Social Determinants of Health  ? ?Financial  Resource Strain: Not on file  ?Food Insecurity: Not on file  ?Transportation Needs: Not on file  ?Physical Activity: Not on file  ?Stress: Not on file  ?Social Connections: Not on file  ?Intimate Partner Violence: Not on file  ? ? ?Family History  ?Problem Relation Age of Onset  ? Hypertension Father   ? Diabetes Sister   ? ? ?Past Surgical History:  ?Procedure Laterality Date  ? CESAREAN SECTION   2004, 2010, 2013  ? x 3  ? CHOLECYSTECTOMY N/A 09/03/2016  ? Procedure: LAPAROSCOPIC CHOLECYSTECTOMY;  Surgeon: Gaynelle Adu, MD;  Location: [facility] OR;  Service: General;  Laterality: N/A;  ? ? ?ROS: ?Review of Systems ?Negative except as stated above ? ?PHYSICAL EXAM: ?BP 108/72 (BP Location: Left Arm, Patient Position: Sitting, Cuff Size: Large)   Pulse 97   Temp 98.7 ?F (37.1 ?C)   Resp 16   Ht 4\' 11"   (1.499 m)   Wt 183 lb (83 kg)   SpO2 97%   BMI 36.96 kg/m?   ?Physical Exam ? ?General appearance - alert, well appearing, and in no distress ?Mental status - normal mood, behavior, speech, dress, motor activity, and thought processes ?Eyes -mild conjunctival injection bilaterally.  No drainage or discharge noted.  No edema of the eyelids. ?Nose - normal and patent, no erythema, discharge or polyps ?Mouth -tonsils are enlarged but not erythematous and no exudates. ?Neck - supple, no significant adenopathy ?Chest - clear to auscultation, no wheezes, rales or rhonchi, symmetric air entry ?Skin: Dry slightly erythematous patches noted on the dorsal surface of the right middle finger in between the web of the third and fourth fingers on the left hand. ? ?  Latest Ref Rng & Units 03/24/2021  ?  1:49 PM 08/02/2018  ?  5:23 PM 05/16/2018  ?  4:58 PM  ?CMP  ?Glucose 70 - 99 mg/dL 92    07/15/2018    ?BUN 6 - 20 mg/dL 12    10    ?Creatinine 0.57 - 1.00 mg/dL 409    8.11    ?Sodium 134 - 144 mmol/L 141    139    ?Potassium 3.5 - 5.2 mmol/L 4.3    4.1    ?Chloride 96 - 106 mmol/L 104    100    ?CO2 20 - 29 mmol/L 23    21    ?Calcium 8.7 - 10.2 mg/dL 9.5    9.5    ?Total Protein 6.0 - 8.5 g/dL 7.6   7.4   7.2    ?Total Bilirubin 0.0 - 1.2 mg/dL 0.3   9.14   0.2    ?Alkaline Phos 44 - 121 IU/L 84   85   82    ?AST 0 - 40 IU/L 18   21   20     ?ALT 0 - 32 IU/L 17   24   17     ? ?Lipid Panel  ?   ?Component Value Date/Time  ? CHOL 226 (H) 03/24/2021 1349  ? TRIG 113 03/24/2021 1349  ? HDL 65 03/24/2021 1349  ? LDLCALC 141 (H) 03/24/2021 1349  ? ? ?CBC ?   ?Component Value Date/Time  ? WBC 8.2 03/24/2021 1349  ? WBC 9.5 09/05/2016 0356  ? RBC 4.57 03/24/2021 1349  ? RBC 3.25 (L) 09/05/2016 0356  ? HGB 13.3 03/24/2021 1349  ? HCT 40.5 03/24/2021 1349  ? PLT 321 03/24/2021 1349  ? MCV 89 03/24/2021 1349  ? MCH 29.1 03/24/2021  1349  ? MCH 28.6 09/05/2016 0356  ? MCHC 32.8 03/24/2021 1349  ? MCHC 32.3 09/05/2016 0356  ? RDW 12.9 03/24/2021  1349  ? LYMPHSABS 2.6 03/24/2021 1349  ? MONOABS 1.0 09/02/2016 1842  ? EOSABS 0.2 03/24/2021 1349  ? BASOSABS 0.0 03/24/2021 1349  ? ? ?ASSESSMENT AND PLAN: ?1. Allergic conjunctivitis of both eyes ?Recommend that she avoid using eye shadow for now. ?We will give a trial of Patanol eyedrops. ?- olopatadine (PATANOL) 0.1 % ophthalmic solution; Place 1 drop into both eyes 2 (two) times daily as needed for allergies.  Dispense: 5 mL; Refill: 1 ? ?2. Chronic dermatitis of hands ?- triamcinolone cream (KENALOG) 0.1 %; Apply 1 application. topically 2 (two) times daily. X 5 days for rash then use as needed  Dispense: 30 g; Refill: 0 ? ? ? ?Patient was given the opportunity to ask questions.  Patient verbalized understanding of the plan and was able to repeat key elements of the plan.  ? ?This documentation was completed using Paediatric nurse.  Any transcriptional errors are unintentional. ? ?No orders of the defined types were placed in this encounter. ? ? ? ?Requested Prescriptions  ? ? No prescriptions requested or ordered in this encounter  ? ? ?No follow-ups on file. ? ?Jonah Blue, MD, FACP ?

## 2021-08-17 NOTE — Progress Notes (Signed)
Pt presents for watery and itchy eyes been going on for a month denies any pain  ?

## 2021-10-12 ENCOUNTER — Ambulatory Visit: Payer: Self-pay

## 2021-10-12 NOTE — Telephone Encounter (Signed)
  Chief Complaint: abdominal pain Symptoms: abdominal pain in lower abdomen and radiates into vaginal area. Pain 5-6 today Frequency: started yesterday Pertinent Negatives: Patient denies vomiting today  Disposition: [] ED /[x] Urgent Care (no appt availability in office) / [] Appointment(In office/virtual)/ []  [facility]/ [] Home Care/ [] Refused Recommended Disposition /[] [facility]/ []  Follow-up with PCP Additional Notes: pt said her pain feels like contractions but she knows she not pregnant. She has taken some Aleve that helped with pain down to 5-6/10 compared to yesterday at 9. Advised her of no appt available until 11/05/21. Recommended based on sx pt go to UC and be seen. Pt was given address for UC on [address]. Refused to schedule appt there was going to walk in.   Reason for Disposition  [1] MILD-MODERATE pain AND [2] constant AND [3] present > 2 hours  Answer Assessment - Initial Assessment Questions 1. LOCATION: "Where does it hurt?"      Underneath belly and near vagina  3. ONSET: "When did the pain begin?" (e.g., minutes, hours or days ago)      Yesterday  5. PATTERN "Does the pain come and go, or is it constant?"    - If constant: "Is it getting better, staying the same, or worsening?"      (Note: Constant means the pain never goes away completely; most serious pain is constant and it progresses)     - If intermittent: "How long does it last?" "Do you have pain now?"     (Note: Intermittent means the pain goes away completely between bouts)     Constant similar to contractions  6. SEVERITY: "How bad is the pain?"  (e.g., Scale 1-10; mild, moderate, or severe)   - MILD (1-3): doesn't interfere with normal activities, abdomen soft and not tender to touch    - MODERATE (4-7): interferes with normal activities or awakens from sleep, abdomen tender to touch    - SEVERE (8-10): excruciating pain, doubled over, unable to do any normal activities      9 yesterday,  today 5-6 10. OTHER SYMPTOMS: "Do you have any other symptoms?" (e.g., back pain, diarrhea, fever, urination pain, vomiting)       Nausea  Protocols used: Abdominal Pain - Female-A-AH

## 2021-11-03 ENCOUNTER — Telehealth (HOSPITAL_BASED_OUTPATIENT_CLINIC_OR_DEPARTMENT_OTHER): Payer: Self-pay | Admitting: Nurse Practitioner

## 2021-11-03 ENCOUNTER — Other Ambulatory Visit: Payer: Self-pay

## 2021-11-03 ENCOUNTER — Encounter: Payer: Self-pay | Admitting: Nurse Practitioner

## 2021-11-03 DIAGNOSIS — Z114 Encounter for screening for human immunodeficiency virus [HIV]: Secondary | ICD-10-CM

## 2021-11-03 DIAGNOSIS — K219 Gastro-esophageal reflux disease without esophagitis: Secondary | ICD-10-CM

## 2021-11-03 DIAGNOSIS — R1030 Lower abdominal pain, unspecified: Secondary | ICD-10-CM

## 2021-11-03 DIAGNOSIS — R7303 Prediabetes: Secondary | ICD-10-CM

## 2021-11-03 MED ORDER — PANTOPRAZOLE SODIUM 40 MG PO TBEC
40.0000 mg | DELAYED_RELEASE_TABLET | Freq: Every day | ORAL | 2 refills | Status: DC
  Filled 2021-11-03: qty 30, 30d supply, fill #0
  Filled 2022-03-03: qty 30, 30d supply, fill #1

## 2021-11-03 NOTE — Progress Notes (Signed)
Virtual Visit via Telephone Note  I discussed the limitations, risks, security and privacy concerns of performing an evaluation and management service by telephone and the availability of in person appointments. I also discussed with the patient that there may be a patient responsible charge related to this service. The patient expressed understanding and agreed to proceed.    I connected with [name] on 11/03/21  at   4:10 PM EDT  EDT by telephone and verified that I am speaking with the correct person using two identifiers.  Location of Patient: Private Residence   Location of Provider: [place] [facility] [place] [facility]    Persons participating in Telemedicine visit: Belinda Rankins FNP-BC [name]  [place] Interpreter ID [phone]   History of Present Illness: Telemedicine visit for: GERD and lower abdominal pain  Had allergic reaction to pepcid and omeprazole. Needs refill of pantoprazole today. GERD symptoms include: epigastric pain and nausea.  She denies any melena and hematochezia.  She denies any chest pain or palpitations, shortness of breath or cough. She has had some fatigue.   Notes lower abdominal pain and cramps with onset 2 weeks ago. Unrelated to food intake or menstrual cycle.  She is s/p cholecystectomy 2020. Endorses intermittent constipation. Denies any GU symptoms.   Past Medical History:  Diagnosis Date   Depression    hx - no meds    Past Surgical History:  Procedure Laterality Date   CESAREAN SECTION   2004, 2010, 2013   x 3   CHOLECYSTECTOMY N/A 09/03/2016   Procedure: LAPAROSCOPIC CHOLECYSTECTOMY;  Surgeon: Greer Pickerel, MD;  Location: [facility];  Service: General;  Laterality: N/A;    Family History  Problem Relation Age of Onset   Hypertension Father    Diabetes Sister     Social History   Socioeconomic History   Marital status: Single    Spouse name: Not on file   Number of children: Not on file   Years of  education: Not on file   Highest education level: Not on file  Occupational History   Not on file  Tobacco Use   Smoking status: Former    Types: Cigars    Quit date: 06/04/2010    Years since quitting: 11.4   Smokeless tobacco: Never  Vaping Use   Vaping Use: Never used  Substance and Sexual Activity   Alcohol use: Yes    Alcohol/week: 1.0 standard drink of alcohol    Types: 1 Cans of beer per week    Comment: socially   Drug use: No   Sexual activity: Yes    Birth control/protection: None  Other Topics Concern   Not on file  Social History Narrative   Not on file   Social Determinants of Health   Financial Resource Strain: Not on file  Food Insecurity: Not on file  Transportation Needs: Not on file  Physical Activity: Not on file  Stress: Not on file  Social Connections: Not on file     Observations/Objective: Awake, alert and oriented x 3   Review of Systems  Constitutional:  Negative for fever, malaise/fatigue and weight loss.  HENT: Negative.  Negative for nosebleeds.   Eyes: Negative.  Negative for blurred vision, double vision and photophobia.  Respiratory: Negative.  Negative for cough and shortness of breath.   Cardiovascular: Negative.  Negative for chest pain, palpitations and leg swelling.  Gastrointestinal:  Positive for abdominal pain, constipation, heartburn and nausea. Negative for blood in stool, diarrhea, melena and vomiting.  Genitourinary: Negative.   Musculoskeletal: Negative.  Negative for myalgias.  Neurological: Negative.  Negative for dizziness, focal weakness, seizures and headaches.  Psychiatric/Behavioral: Negative.  Negative for suicidal ideas.     Assessment and Plan: Diagnoses and all orders for this visit:  GERD without esophagitis -     pantoprazole (PROTONIX) 40 MG tablet; Take 1 tablet (40 mg total) by mouth daily. INSTRUCTIONS: Avoid GERD Triggers: acidic, spicy or fried foods, caffeine, coffee, sodas,  alcohol and chocolate.     Prediabetes -     CMP14+EGFR -     Hemoglobin A1c  Lower abdominal pain -     Urinalysis, Complete -     Cervicovaginal ancillary only -     CMP14+EGFR -     CBC -     POCT urine pregnancy  Encounter for screening for HIV -     HIV antibody (with reflex)     Follow Up Instructions Return in about 4 weeks (around 12/01/2021).  Follow up in 4 weeks for abdominal pain. See if resolved with takng PPI.     I discussed the assessment and treatment plan with the patient. The patient was provided an opportunity to ask questions and all were answered. The patient agreed with the plan and demonstrated an understanding of the instructions.   The patient was advised to call back or seek an in-person evaluation if the symptoms worsen or if the condition fails to improve as anticipated.  I provided 13 minutes of non-face-to-face time during this encounter including median intraservice time, reviewing previous notes, labs, imaging, medications and explaining diagnosis and management.  Gildardo Pounds, FNP-BC

## 2021-11-06 ENCOUNTER — Other Ambulatory Visit: Payer: Self-pay

## 2021-11-06 ENCOUNTER — Ambulatory Visit: Payer: Self-pay | Attending: Family Medicine

## 2021-11-07 LAB — URINALYSIS, COMPLETE
Bilirubin, UA: NEGATIVE
Glucose, UA: NEGATIVE
Ketones, UA: NEGATIVE
Leukocytes,UA: NEGATIVE
Nitrite, UA: NEGATIVE
Protein,UA: NEGATIVE
RBC, UA: NEGATIVE
Specific Gravity, UA: 1.014 (ref 1.005–1.030)
Urobilinogen, Ur: 0.2 mg/dL (ref 0.2–1.0)
pH, UA: 6 (ref 5.0–7.5)

## 2021-11-07 LAB — CMP14+EGFR
ALT: 16 IU/L (ref 0–32)
AST: 20 IU/L (ref 0–40)
Albumin/Globulin Ratio: 2 (ref 1.2–2.2)
Albumin: 4.7 g/dL (ref 3.9–4.9)
Alkaline Phosphatase: 68 IU/L (ref 44–121)
BUN/Creatinine Ratio: 19 (ref 9–23)
BUN: 10 mg/dL (ref 6–20)
Bilirubin Total: 0.2 mg/dL (ref 0.0–1.2)
CO2: 23 mmol/L (ref 20–29)
Calcium: 9.5 mg/dL (ref 8.7–10.2)
Chloride: 100 mmol/L (ref 96–106)
Creatinine, Ser: 0.54 mg/dL — ABNORMAL LOW (ref 0.57–1.00)
Globulin, Total: 2.4 g/dL (ref 1.5–4.5)
Glucose: 114 mg/dL — ABNORMAL HIGH (ref 70–99)
Potassium: 4.1 mmol/L (ref 3.5–5.2)
Sodium: 140 mmol/L (ref 134–144)
Total Protein: 7.1 g/dL (ref 6.0–8.5)
eGFR: 121 mL/min/{1.73_m2} (ref >59)

## 2021-11-07 LAB — CBC
Hematocrit: 36 % (ref 34.0–46.6)
Hemoglobin: 12.1 g/dL (ref 11.1–15.9)
MCH: 29.4 pg (ref 26.6–33.0)
MCHC: 33.6 g/dL (ref 31.5–35.7)
MCV: 87 fL (ref 79–97)
Platelets: 305 10*3/uL (ref 150–450)
RBC: 4.12 x10E6/uL (ref 3.77–5.28)
RDW: 13.1 % (ref 11.7–15.4)
WBC: 7 10*3/uL (ref 3.4–10.8)

## 2021-11-07 LAB — MICROSCOPIC EXAMINATION: Casts: NONE SEEN /lpf

## 2021-11-07 LAB — HEMOGLOBIN A1C
Est. average glucose Bld gHb Est-mCnc: 123 mg/dL
Hgb A1c MFr Bld: 5.9 % — ABNORMAL HIGH (ref 4.8–5.6)

## 2021-11-07 LAB — HIV ANTIBODY (ROUTINE TESTING W REFLEX): HIV Screen 4th Generation wRfx: NONREACTIVE

## 2021-11-08 ENCOUNTER — Other Ambulatory Visit: Payer: Self-pay | Admitting: Nurse Practitioner

## 2021-11-08 DIAGNOSIS — Z114 Encounter for screening for human immunodeficiency virus [HIV]: Secondary | ICD-10-CM

## 2021-11-08 DIAGNOSIS — R1030 Lower abdominal pain, unspecified: Secondary | ICD-10-CM

## 2021-11-30 ENCOUNTER — Encounter: Payer: Self-pay | Admitting: Family Medicine

## 2021-11-30 ENCOUNTER — Other Ambulatory Visit: Payer: Self-pay

## 2021-11-30 ENCOUNTER — Ambulatory Visit: Payer: Self-pay | Attending: Family Medicine | Admitting: Family Medicine

## 2021-11-30 VITALS — BP 107/69 | HR 75 | Temp 98.6°F | Ht 59.0 in | Wt 181.2 lb

## 2021-11-30 DIAGNOSIS — R109 Unspecified abdominal pain: Secondary | ICD-10-CM

## 2021-11-30 DIAGNOSIS — G5602 Carpal tunnel syndrome, left upper limb: Secondary | ICD-10-CM

## 2021-11-30 DIAGNOSIS — G5600 Carpal tunnel syndrome, unspecified upper limb: Secondary | ICD-10-CM | POA: Insufficient documentation

## 2021-11-30 MED ORDER — PREDNISONE 20 MG PO TABS
20.0000 mg | ORAL_TABLET | Freq: Every day | ORAL | 0 refills | Status: DC
  Filled 2021-11-30: qty 5, 5d supply, fill #0

## 2021-11-30 MED ORDER — GABAPENTIN 300 MG PO CAPS
300.0000 mg | ORAL_CAPSULE | Freq: Every day | ORAL | 3 refills | Status: AC
  Filled 2021-11-30: qty 30, 30d supply, fill #0
  Filled 2022-03-03: qty 30, 30d supply, fill #1

## 2021-11-30 NOTE — Progress Notes (Signed)
Subjective:  Patient ID: [name], female    DOB: 15-Feb-1986  Age: 36 y.o. MRN: [id number]  CC: Abdominal Pain   HPI [name] is a 36 y.o. year old female with a history of obesity, hyperlipidemia here for an office visit.  Interval History: She had a telehealth visit 4 weeks ago for abdominal pain for which she was commenced on Protonix with recommendation to follow-up in the office. Pain occurs in her RUQ and also radiates to her lower abdominal region. This is associated with nausea but no vomiting, diarrhea, or constipation and her Protonix has been ineffective. Abdominal pain has been present x3 months, lasts for 3 days then resolves. She endorses presence of reflux.She sometimes feels her abdomen is bloated but denies excessive flatulence or burping. Review of her chart indicates she has been seen by GI for similar symptoms 2 years ago.  She Complains of numbness nausea her left forearm radiating to her hand and occurs at night as well and she has to shake her hand for relief.  Symptoms occur when she raises her hand to comb her hair.  She is right-hand dominant.  8 months ago she was prescribed prednisone which was beneficial. Past Medical History:  Diagnosis Date   Depression    hx - no meds    Past Surgical History:  Procedure Laterality Date   CESAREAN SECTION   2004, 2010, 2013   x 3   CHOLECYSTECTOMY N/A 09/03/2016   Procedure: LAPAROSCOPIC CHOLECYSTECTOMY;  Surgeon: Gaynelle Adu, MD;  Location: [facility] OR;  Service: General;  Laterality: N/A;    Family History  Problem Relation Age of Onset   Hypertension Father    Diabetes Sister     Social History   Socioeconomic History   Marital status: Single    Spouse name: Not on file   Number of children: Not on file   Years of education: Not on file   Highest education level: Not on file  Occupational History   Not on file  Tobacco Use   Smoking status: Former    Types: Cigars    Quit date:  06/04/2010    Years since quitting: 11.4   Smokeless tobacco: Never  Vaping Use   Vaping Use: Never used  Substance and Sexual Activity   Alcohol use: Yes    Alcohol/week: 1.0 standard drink of alcohol    Types: 1 Cans of beer per week    Comment: socially   Drug use: No   Sexual activity: Yes    Birth control/protection: None  Other Topics Concern   Not on file  Social History Narrative   Not on file   Social Determinants of Health   Financial Resource Strain: Not on file  Food Insecurity: Not on file  Transportation Needs: Not on file  Physical Activity: Not on file  Stress: Not on file  Social Connections: Not on file    Allergies  Allergen Reactions   Omeprazole Rash    Outpatient Medications Prior to Visit  Medication Sig Dispense Refill   diclofenac (VOLTAREN) 75 MG EC tablet Take 1 tablet (75 mg total) by mouth 2 (two) times daily. 60 tablet 2   famotidine (PEPCID) 20 MG tablet Take 1 tablet (20 mg total) by mouth 2 (two) times daily. To decrease stomach acid 60 tablet 3   loratadine (CLARITIN) 10 MG tablet Take 1 tablet (10 mg total) by mouth daily. 10 tablet 0   olopatadine (PATANOL) 0.1 % ophthalmic solution Place 1  drop into both eyes 2 (two) times daily as needed for allergies. 5 mL 1   ondansetron (ZOFRAN-ODT) 4 MG disintegrating tablet Take 1 tablet (4 mg total) by mouth every 6 (six) hours as needed for nausea. 20 tablet 0   pantoprazole (PROTONIX) 40 MG tablet Take 1 tablet (40 mg total) by mouth daily. 30 tablet 2   senna (SENOKOT) 8.6 MG TABS tablet Take 1 tablet (8.6 mg total) by mouth 2 (two) times daily. 120 each 0   triamcinolone cream (KENALOG) 0.1 % Apply 1 application topically 2 (two) times daily for 5 days for rash then use as needed 30 g 0   No facility-administered medications prior to visit.     ROS Review of Systems  Constitutional:  Negative for activity change and appetite change.  HENT:  Negative for sinus pressure and sore throat.    Respiratory:  Negative for chest tightness, shortness of breath and wheezing.   Cardiovascular:  Negative for chest pain and palpitations.  Gastrointestinal:  Positive for abdominal pain. Negative for abdominal distention and constipation.  Genitourinary: Negative.   Musculoskeletal: Negative.   Neurological:  Positive for numbness.  Psychiatric/Behavioral:  Negative for behavioral problems and dysphoric mood.     Objective:  BP 107/69   Pulse 75   Temp 98.6 F (37 C) (Oral)   Ht 4\' 11"  (1.499 m)   Wt 181 lb 3.2 oz (82.2 kg)   SpO2 98%   BMI 36.60 kg/m      11/30/2021   11:07 AM 08/17/2021    8:53 AM 05/25/2021    1:48 PM  BP/Weight  Systolic BP 107 108 114  Diastolic BP 69 72 75  Wt. (Lbs) 181.2 183 184.6  BMI 36.6 kg/m2 36.96 kg/m2 37.28 kg/m2      Physical Exam Constitutional:      Appearance: She is well-developed.  Cardiovascular:     Rate and Rhythm: Normal rate.     Heart sounds: Normal heart sounds. No murmur heard. Pulmonary:     Effort: Pulmonary effort is normal.     Breath sounds: Normal breath sounds. No wheezing or rales.  Chest:     Chest wall: No tenderness.  Abdominal:     General: Bowel sounds are normal. There is no distension.     Palpations: Abdomen is soft. There is no mass.     Tenderness: There is no abdominal tenderness.  Musculoskeletal:        General: Normal range of motion.     Right lower leg: No edema.     Left lower leg: No edema.  Neurological:     Mental Status: She is alert and oriented to person, place, and time.  Psychiatric:        Mood and Affect: Mood normal.        Latest Ref Rng & Units 11/06/2021    4:27 PM 03/24/2021    1:49 PM 08/02/2018    5:23 PM  CMP  Glucose 70 - 99 mg/dL 08/04/2018  92    BUN 6 - 20 mg/dL 10  12    Creatinine 662 - 1.00 mg/dL 9.47  6.54    Sodium 6.50 - 144 mmol/L 140  141    Potassium 3.5 - 5.2 mmol/L 4.1  4.3    Chloride 96 - 106 mmol/L 100  104    CO2 20 - 29 mmol/L 23  23    Calcium  8.7 - 10.2 mg/dL 9.5  9.5    Total  Protein 6.0 - 8.5 g/dL 7.1  7.6  7.4   Total Bilirubin 0.0 - 1.2 mg/dL 0.2  0.3  <6.1   Alkaline Phos 44 - 121 IU/L 68  84  85   AST 0 - 40 IU/L 20  18  21    ALT 0 - 32 IU/L 16  17  24      Lipid Panel     Component Value Date/Time   CHOL 226 (H) 03/24/2021 1349   TRIG 113 03/24/2021 1349   HDL 65 03/24/2021 1349   LDLCALC 141 (H) 03/24/2021 1349    CBC    Component Value Date/Time   WBC 7.0 11/06/2021 1627   WBC 9.5 09/05/2016 0356   RBC 4.12 11/06/2021 1627   RBC 3.25 (L) 09/05/2016 0356   HGB 12.1 11/06/2021 1627   HCT 36.0 11/06/2021 1627   PLT 305 11/06/2021 1627   MCV 87 11/06/2021 1627   MCH 29.4 11/06/2021 1627   MCH 28.6 09/05/2016 0356   MCHC 33.6 11/06/2021 1627   MCHC 32.3 09/05/2016 0356   RDW 13.1 11/06/2021 1627   LYMPHSABS 2.6 03/24/2021 1349   MONOABS 1.0 09/02/2016 1842   EOSABS 0.2 03/24/2021 1349   BASOSABS 0.0 03/24/2021 1349    Lab Results  Component Value Date   HGBA1C 5.9 (H) 11/06/2021   Assessment & Plan:  1. Carpal tunnel syndrome of left wrist Uncontrolled Offered to refer her for cortisone injections however she would like to try conservative treatment first Advised to use wrist brace - gabapentin (NEURONTIN) 300 MG capsule; Take 1 capsule (300 mg total) by mouth at bedtime.  Dispense: 30 capsule; Refill: 3 - predniSONE (DELTASONE) 20 MG tablet; Take 1 tablet (20 mg total) by mouth daily with breakfast.  Dispense: 5 tablet; Refill: 0  2. Abdominal pain, unspecified abdominal location Uncontrolled on PPI Advised to hold off on PPI x2 weeks and we will check a H. pylori breath test - H. pylori breath test; Future - CT Abdomen Pelvis W Contrast; Future  Meds ordered this encounter  Medications   gabapentin (NEURONTIN) 300 MG capsule    Sig: Take 1 capsule (300 mg total) by mouth at bedtime.    Dispense:  30 capsule    Refill:  3   predniSONE (DELTASONE) 20 MG tablet    Sig: Take 1 tablet (20 mg  total) by mouth daily with breakfast.    Dispense:  5 tablet    Refill:  0    Follow-up: Return in about 3 months (around 03/02/2022) for Abdominal pain.       01/06/2022, MD, FAAFP. [facility] [place], [facility] [place]   11/30/2021, 12:54 PM

## 2021-12-10 ENCOUNTER — Ambulatory Visit (HOSPITAL_COMMUNITY): Payer: Self-pay

## 2022-03-03 ENCOUNTER — Other Ambulatory Visit: Payer: Self-pay | Admitting: Internal Medicine

## 2022-03-03 ENCOUNTER — Other Ambulatory Visit: Payer: Self-pay

## 2022-03-03 ENCOUNTER — Ambulatory Visit: Payer: Self-pay | Attending: Family Medicine | Admitting: Family Medicine

## 2022-03-03 VITALS — BP 99/65 | HR 75 | Temp 97.9°F | Ht 59.0 in | Wt 186.2 lb

## 2022-03-03 DIAGNOSIS — J029 Acute pharyngitis, unspecified: Secondary | ICD-10-CM

## 2022-03-03 DIAGNOSIS — Z23 Encounter for immunization: Secondary | ICD-10-CM

## 2022-03-03 DIAGNOSIS — G5602 Carpal tunnel syndrome, left upper limb: Secondary | ICD-10-CM

## 2022-03-03 DIAGNOSIS — H1013 Acute atopic conjunctivitis, bilateral: Secondary | ICD-10-CM

## 2022-03-03 MED ORDER — OLOPATADINE HCL 0.1 % OP SOLN
1.0000 [drp] | Freq: Two times a day (BID) | OPHTHALMIC | 0 refills | Status: DC | PRN
  Filled 2022-03-03: qty 5, 25d supply, fill #0

## 2022-03-03 NOTE — Patient Instructions (Signed)

## 2022-03-03 NOTE — Telephone Encounter (Signed)
Requested Prescriptions  Pending Prescriptions Disp Refills   olopatadine (PATANOL) 0.1 % ophthalmic solution 5 mL 0    Sig: Place 1 drop into both eyes 2 (two) times daily as needed for allergies.     Ophthalmology:  Antiallergy Passed - 03/03/2022  2:50 PM      Passed - Valid encounter within last 12 months    Recent Outpatient Visits           Today Carpal tunnel syndrome of left wrist   [facility] Hoy Register, MD   3 months ago Abdominal pain, unspecified abdominal location   [facility] Hoy Register, MD   4 months ago GERD without esophagitis   [facility] Claiborne Rigg, NP   6 months ago Allergic conjunctivitis of both eyes   [facility] Marcine Matar, MD   9 months ago Screening for STD (sexually transmitted disease)    [facility] Hoy Register, MD       Future Appointments             In 2 months Hoy Register, MD [facility]

## 2022-03-03 NOTE — Progress Notes (Signed)
Subjective:  Patient ID: [name], female    DOB: [birth year]  Age: 36 y.o. MRN: [id number]  CC: Abdominal Pain   HPI [name] is a 36 y.o. year old female with a history of Carpal tunnel syndrome.  Interval History: She was seen at her last visit for abdominal pain and she was advised to hold off on her PPI for 2 weeks and H. pylori test ordered which she never returned for.  CT abdomen was ordered but she never underwent this. She discontinued her PPI as her abdominal symptoms resolved.  Her carpal tunnel syndrome is also controlled on her current medications.  She has noticed sore throat today but has no cough, dyspnea, sinus pressure and denies present Past Medical History:  Diagnosis Date   Depression    hx - no meds    Past Surgical History:  Procedure Laterality Date   CESAREAN SECTION   2004, 2010, 2013   x 3   CHOLECYSTECTOMY N/A 09/03/2016   Procedure: LAPAROSCOPIC CHOLECYSTECTOMY;  Surgeon: Gaynelle Adu, MD;  Location: [facility] OR;  Service: General;  Laterality: N/A;    Family History  Problem Relation Age of Onset   Hypertension Father    Diabetes Sister     Social History   Socioeconomic History   Marital status: Single    Spouse name: Not on file   Number of children: Not on file   Years of education: Not on file   Highest education level: Not on file  Occupational History   Not on file  Tobacco Use   Smoking status: Former    Types: Cigars    Quit date: 06/04/2010    Years since quitting: 11.7   Smokeless tobacco: Never  Vaping Use   Vaping Use: Never used  Substance and Sexual Activity   Alcohol use: Yes    Alcohol/week: 1.0 standard drink of alcohol    Types: 1 Cans of beer per week    Comment: socially   Drug use: No   Sexual activity: Yes    Birth control/protection: None  Other Topics Concern   Not on file  Social History Narrative   Not on file   Social Determinants of Health   Financial Resource Strain: Not on  file  Food Insecurity: Not on file  Transportation Needs: Not on file  Physical Activity: Not on file  Stress: Not on file  Social Connections: Not on file    Allergies  Allergen Reactions   Omeprazole Rash    Outpatient Medications Prior to Visit  Medication Sig Dispense Refill   diclofenac (VOLTAREN) 75 MG EC tablet Take 1 tablet (75 mg total) by mouth 2 (two) times daily. 60 tablet 2   famotidine (PEPCID) 20 MG tablet Take 1 tablet (20 mg total) by mouth 2 (two) times daily. To decrease stomach acid 60 tablet 3   gabapentin (NEURONTIN) 300 MG capsule Take 1 capsule (300 mg total) by mouth at bedtime. 30 capsule 3   loratadine (CLARITIN) 10 MG tablet Take 1 tablet (10 mg total) by mouth daily. 10 tablet 0   olopatadine (PATANOL) 0.1 % ophthalmic solution Place 1 drop into both eyes 2 (two) times daily as needed for allergies. 5 mL 1   ondansetron (ZOFRAN-ODT) 4 MG disintegrating tablet Take 1 tablet (4 mg total) by mouth every 6 (six) hours as needed for nausea. 20 tablet 0   pantoprazole (PROTONIX) 40 MG tablet Take 1 tablet (40 mg total) by mouth daily. 30 tablet 2  senna (SENOKOT) 8.6 MG TABS tablet Take 1 tablet (8.6 mg total) by mouth 2 (two) times daily. 120 each 0   triamcinolone cream (KENALOG) 0.1 % Apply 1 application topically 2 (two) times daily for 5 days for rash then use as needed 30 g 0   predniSONE (DELTASONE) 20 MG tablet Take 1 tablet (20 mg total) by mouth daily with breakfast. (Patient not taking: Reported on 03/03/2022) 5 tablet 0   No facility-administered medications prior to visit.     ROS Review of Systems  Constitutional:  Negative for activity change and appetite change.  HENT:  Positive for sore throat. Negative for sinus pressure.   Respiratory:  Negative for chest tightness, shortness of breath and wheezing.   Cardiovascular:  Negative for chest pain and palpitations.  Gastrointestinal:  Negative for abdominal distention, abdominal pain and  constipation.  Genitourinary: Negative.   Musculoskeletal: Negative.   Psychiatric/Behavioral:  Negative for behavioral problems and dysphoric mood.     Objective:  BP 99/65   Pulse 75   Temp 97.9 F (36.6 C) (Oral)   Ht 4\' 11"  (1.499 m)   Wt 186 lb 3.2 oz (84.5 kg)   SpO2 97%   BMI 37.61 kg/m      03/03/2022    2:05 PM 11/30/2021   11:07 AM 08/17/2021    8:53 AM  BP/Weight  Systolic BP 99 107 108  Diastolic BP 65 69 72  Wt. (Lbs) 186.2 181.2 183  BMI 37.61 kg/m2 36.6 kg/m2 36.96 kg/m2      Physical Exam Constitutional:      Appearance: She is well-developed.  HENT:     Mouth/Throat:     Mouth: Mucous membranes are moist.  Cardiovascular:     Rate and Rhythm: Normal rate.     Heart sounds: Normal heart sounds. No murmur heard. Pulmonary:     Effort: Pulmonary effort is normal.     Breath sounds: Normal breath sounds. No wheezing or rales.  Chest:     Chest wall: No tenderness.  Abdominal:     General: Bowel sounds are normal. There is no distension.     Palpations: Abdomen is soft. There is no mass.     Tenderness: There is no abdominal tenderness.  Musculoskeletal:        General: Normal range of motion.     Right lower leg: No edema.     Left lower leg: No edema.     Comments: Positive Phalen sign in left wrist, negative on right Negative Tinel signs bilaterally  Neurological:     Mental Status: She is alert and oriented to person, place, and time.  Psychiatric:        Mood and Affect: Mood normal.        Latest Ref Rng & Units 11/06/2021    4:27 PM 03/24/2021    1:49 PM 08/02/2018    5:23 PM  CMP  Glucose 70 - 99 mg/dL 08/04/2018  92    BUN 6 - 20 mg/dL 10  12    Creatinine 073 - 1.00 mg/dL 7.10  6.26    Sodium 9.48 - 144 mmol/L 140  141    Potassium 3.5 - 5.2 mmol/L 4.1  4.3    Chloride 96 - 106 mmol/L 100  104    CO2 20 - 29 mmol/L 23  23    Calcium 8.7 - 10.2 mg/dL 9.5  9.5    Total Protein 6.0 - 8.5 g/dL 7.1  7.6  7.4  Total Bilirubin 0.0 - 1.2  mg/dL 0.2  0.3  <5.7   Alkaline Phos 44 - 121 IU/L 68  84  85   AST 0 - 40 IU/L 20  18  21    ALT 0 - 32 IU/L 16  17  24      Lipid Panel     Component Value Date/Time   CHOL 226 (H) 03/24/2021 1349   TRIG 113 03/24/2021 1349   HDL 65 03/24/2021 1349   LDLCALC 141 (H) 03/24/2021 1349    CBC    Component Value Date/Time   WBC 7.0 11/06/2021 1627   WBC 9.5 09/05/2016 0356   RBC 4.12 11/06/2021 1627   RBC 3.25 (L) 09/05/2016 0356   HGB 12.1 11/06/2021 1627   HCT 36.0 11/06/2021 1627   PLT 305 11/06/2021 1627   MCV 87 11/06/2021 1627   MCH 29.4 11/06/2021 1627   MCH 28.6 09/05/2016 0356   MCHC 33.6 11/06/2021 1627   MCHC 32.3 09/05/2016 0356   RDW 13.1 11/06/2021 1627   LYMPHSABS 2.6 03/24/2021 1349   MONOABS 1.0 09/02/2016 1842   EOSABS 0.2 03/24/2021 1349   BASOSABS 0.0 03/24/2021 1349    Lab Results  Component Value Date   HGBA1C 5.9 (H) 11/06/2021    Assessment & Plan:  1. Carpal tunnel syndrome of left wrist Controlled Continue gabapentin  2. Sore throat Exam is normal Advised to obtain antihistamine OTC  3. Flu vaccine need - Flu Vaccine MDCK QUAD PF    No orders of the defined types were placed in this encounter.   Follow-up: Return if symptoms worsen or fail to improve.       03/26/2021, MD, FAAFP. [facility] [place], [facility] [place]   03/03/2022, 3:44 PM

## 2022-03-10 ENCOUNTER — Other Ambulatory Visit: Payer: Self-pay

## 2022-05-25 ENCOUNTER — Ambulatory Visit: Payer: Self-pay | Attending: Family Medicine | Admitting: Family Medicine

## 2022-05-25 ENCOUNTER — Other Ambulatory Visit (HOSPITAL_COMMUNITY)
Admission: RE | Admit: 2022-05-25 | Discharge: 2022-05-25 | Disposition: A | Discharge status: Home / Self Care | Payer: Self-pay | Source: Ambulatory Visit | Attending: Family Medicine | Admitting: Family Medicine

## 2022-05-25 ENCOUNTER — Encounter: Payer: Self-pay | Admitting: Family Medicine

## 2022-05-25 ENCOUNTER — Other Ambulatory Visit: Payer: Self-pay

## 2022-05-25 VITALS — BP 124/78 | HR 72 | Temp 98.0°F | Ht 59.0 in | Wt 184.0 lb

## 2022-05-25 DIAGNOSIS — Z0001 Encounter for general adult medical examination with abnormal findings: Secondary | ICD-10-CM

## 2022-05-25 DIAGNOSIS — H1013 Acute atopic conjunctivitis, bilateral: Secondary | ICD-10-CM

## 2022-05-25 DIAGNOSIS — G5602 Carpal tunnel syndrome, left upper limb: Secondary | ICD-10-CM

## 2022-05-25 DIAGNOSIS — Z13228 Encounter for screening for other metabolic disorders: Secondary | ICD-10-CM

## 2022-05-25 DIAGNOSIS — N898 Other specified noninflammatory disorders of vagina: Secondary | ICD-10-CM

## 2022-05-25 MED ORDER — OLOPATADINE HCL 0.1 % OP SOLN
1.0000 [drp] | Freq: Two times a day (BID) | OPHTHALMIC | 2 refills | Status: DC | PRN
  Filled 2022-05-25: qty 5, 25d supply, fill #0

## 2022-05-25 NOTE — Patient Instructions (Signed)

## 2022-05-25 NOTE — Progress Notes (Signed)
Subjective:  Patient ID: [name], female    DOB: 1986/01/15  Age: 37 y.o. MRN: [id number]  CC: Annual Exam   HPI [name] is a 37 y.o. year old female with a history of carpal tunnel syndrome here for an annual physical exam  Interval History: Her Pap smear was normal, HPV negative in 05/2021.  She denies family history of breast cancer and does not meet his requirement for breast cancer screening yet. She does not exercise regularly She has intermittent vaginal itching but no discharge.  She still has pain in her right wrist and this now occurs in her left hand. Wrists also feel swollen.  She uses gabapentin for pain. Past Medical History:  Diagnosis Date   Depression    hx - no meds    Past Surgical History:  Procedure Laterality Date   CESAREAN SECTION   2004, 2010, 2013   x 3   CHOLECYSTECTOMY N/A 09/03/2016   Procedure: LAPAROSCOPIC CHOLECYSTECTOMY;  Surgeon: Greer Pickerel, MD;  Location: [facility];  Service: General;  Laterality: N/A;    Family History  Problem Relation Age of Onset   Hypertension Father    Diabetes Sister     Social History   Socioeconomic History   Marital status: Single    Spouse name: Not on file   Number of children: Not on file   Years of education: Not on file   Highest education level: Not on file  Occupational History   Not on file  Tobacco Use   Smoking status: Former    Types: Cigars    Quit date: 06/04/2010    Years since quitting: 11.9   Smokeless tobacco: Never  Vaping Use   Vaping Use: Never used  Substance and Sexual Activity   Alcohol use: Yes    Alcohol/week: 1.0 standard drink of alcohol    Types: 1 Cans of beer per week    Comment: socially   Drug use: No   Sexual activity: Yes    Birth control/protection: None  Other Topics Concern   Not on file  Social History Narrative   Not on file   Social Determinants of Health   Financial Resource Strain: Not on file  Food Insecurity: Not on file   Transportation Needs: Not on file  Physical Activity: Not on file  Stress: Not on file  Social Connections: Not on file    Allergies  Allergen Reactions   Omeprazole Rash    Outpatient Medications Prior to Visit  Medication Sig Dispense Refill   gabapentin (NEURONTIN) 300 MG capsule Take 1 capsule (300 mg total) by mouth at bedtime. 30 capsule 3   loratadine (CLARITIN) 10 MG tablet Take 1 tablet (10 mg total) by mouth daily. 10 tablet 0   olopatadine (PATANOL) 0.1 % ophthalmic solution Place 1 drop into both eyes 2 (two) times daily as needed for allergies. 5 mL 0   diclofenac (VOLTAREN) 75 MG EC tablet Take 1 tablet (75 mg total) by mouth 2 (two) times daily. (Patient not taking: Reported on 05/25/2022) 60 tablet 2   famotidine (PEPCID) 20 MG tablet Take 1 tablet (20 mg total) by mouth 2 (two) times daily. To decrease stomach acid (Patient not taking: Reported on 05/25/2022) 60 tablet 3   ondansetron (ZOFRAN-ODT) 4 MG disintegrating tablet Take 1 tablet (4 mg total) by mouth every 6 (six) hours as needed for nausea. (Patient not taking: Reported on 05/25/2022) 20 tablet 0   pantoprazole (PROTONIX) 40 MG tablet Take  1 tablet (40 mg total) by mouth daily. (Patient not taking: Reported on 05/25/2022) 30 tablet 2   predniSONE (DELTASONE) 20 MG tablet Take 1 tablet (20 mg total) by mouth daily with breakfast. (Patient not taking: Reported on 03/03/2022) 5 tablet 0   senna (SENOKOT) 8.6 MG TABS tablet Take 1 tablet (8.6 mg total) by mouth 2 (two) times daily. (Patient not taking: Reported on 05/25/2022) 120 each 0   triamcinolone cream (KENALOG) 0.1 % Apply 1 application topically 2 (two) times daily for 5 days for rash then use as needed (Patient not taking: Reported on 05/25/2022) 30 g 0   No facility-administered medications prior to visit.     ROS Review of Systems  Constitutional:  Negative for activity change, appetite change and fatigue.  HENT:  Negative for congestion, sinus pressure  and sore throat.   Eyes:  Negative for visual disturbance.  Respiratory:  Negative for cough, chest tightness, shortness of breath and wheezing.   Cardiovascular:  Negative for chest pain and palpitations.  Gastrointestinal:  Negative for abdominal distention, abdominal pain and constipation.  Endocrine: Negative for polydipsia.  Genitourinary:  Negative for dysuria and frequency.  Musculoskeletal:  Negative for arthralgias and back pain.  Skin:  Negative for rash.  Neurological:  Negative for tremors, light-headedness and numbness.  Hematological:  Does not bruise/bleed easily.  Psychiatric/Behavioral:  Negative for agitation and behavioral problems.     Objective:  BP 124/78   Pulse 72   Temp 98 F (36.7 C) (Oral)   Ht 4' 11"$  (1.499 m)   Wt 184 lb (83.5 kg)   SpO2 98%   BMI 37.16 kg/m      05/25/2022   10:24 AM 03/03/2022    2:05 PM 11/30/2021   11:07 AM  BP/Weight  Systolic BP [id number]  Diastolic BP 78 65 69  Wt. (Lbs) 184 186.2 181.2  BMI 37.16 kg/m2 37.61 kg/m2 36.6 kg/m2      Physical Exam Constitutional:      General: She is not in acute distress.    Appearance: She is well-developed. She is not diaphoretic.  HENT:     Head: Normocephalic.     Right Ear: External ear normal.     Left Ear: External ear normal.     Nose: Nose normal.     Mouth/Throat:     Mouth: Mucous membranes are moist.  Eyes:     Extraocular Movements: Extraocular movements intact.     Conjunctiva/sclera: Conjunctivae normal.     Pupils: Pupils are equal, round, and reactive to light.  Neck:     Vascular: No JVD.  Cardiovascular:     Rate and Rhythm: Normal rate and regular rhythm.     Pulses: Normal pulses.     Heart sounds: Normal heart sounds. No murmur heard.    No gallop.  Pulmonary:     Effort: Pulmonary effort is normal. No respiratory distress.     Breath sounds: Normal breath sounds. No wheezing or rales.  Chest:     Chest wall: No tenderness.  Abdominal:      General: Bowel sounds are normal. There is no distension.     Palpations: Abdomen is soft. There is no mass.     Tenderness: There is no abdominal tenderness.  Musculoskeletal:        General: No tenderness. Normal range of motion.     Cervical back: Normal range of motion.     Comments: Positive Phalen sign in right wrist  Skin:    General: Skin is warm and dry.  Neurological:     Mental Status: She is alert and oriented to person, place, and time.     Deep Tendon Reflexes: Reflexes are normal and symmetric.  Psychiatric:        Mood and Affect: Mood normal.        Latest Ref Rng & Units 11/06/2021    4:27 PM 03/24/2021    1:49 PM 08/02/2018    5:23 PM  CMP  Glucose 70 - 99 mg/dL 114  92    BUN 6 - 20 mg/dL 10  12    Creatinine 0.57 - 1.00 mg/dL 0.54  0.58    Sodium 134 - 144 mmol/L 140  141    Potassium 3.5 - 5.2 mmol/L 4.1  4.3    Chloride 96 - 106 mmol/L 100  104    CO2 20 - 29 mmol/L 23  23    Calcium 8.7 - 10.2 mg/dL 9.5  9.5    Total Protein 6.0 - 8.5 g/dL 7.1  7.6  7.4   Total Bilirubin 0.0 - 1.2 mg/dL 0.2  0.3  <0.2   Alkaline Phos 44 - 121 IU/L 68  84  85   AST 0 - 40 IU/L 20  18  21   $ ALT 0 - 32 IU/L 16  17  24     $ Lipid Panel     Component Value Date/Time   CHOL 226 (H) 03/24/2021 1349   TRIG 113 03/24/2021 1349   HDL 65 03/24/2021 1349   LDLCALC 141 (H) 03/24/2021 1349    CBC    Component Value Date/Time   WBC 7.0 11/06/2021 1627   WBC 9.5 09/05/2016 0356   RBC 4.12 11/06/2021 1627   RBC 3.25 (L) 09/05/2016 0356   HGB 12.1 11/06/2021 1627   HCT 36.0 11/06/2021 1627   PLT 305 11/06/2021 1627   MCV 87 11/06/2021 1627   MCH 29.4 11/06/2021 1627   MCH 28.6 09/05/2016 0356   MCHC 33.6 11/06/2021 1627   MCHC 32.3 09/05/2016 0356   RDW 13.1 11/06/2021 1627   LYMPHSABS 2.6 03/24/2021 1349   MONOABS 1.0 09/02/2016 1842   EOSABS 0.2 03/24/2021 1349   BASOSABS 0.0 03/24/2021 1349    Lab Results  Component Value Date   HGBA1C 5.9 (H) 11/06/2021     Assessment & Plan:  1. Annual visit for general adult medical examination with abnormal findings Counseled on 150 minutes of exercise per week, healthy eating (including decreased daily intake of saturated fats, cholesterol, added sugars, sodium), STI prevention, routine healthcare maintenance.   2. Vagina itching - Cervicovaginal ancillary only  3. Allergic conjunctivitis of both eyes - olopatadine (PATANOL) 0.1 % ophthalmic solution; Place 1 drop into both eyes 2 (two) times daily as needed for allergies.  Dispense: 5 mL; Refill: 2  4. Screening for metabolic disorder - LP+Non-HDL Cholesterol - CMP14+EGFR - CBC with Differential/Platelet  5. Carpal tunnel syndrome of left wrist Stable on gabapentin We have discussed options for cortisone injection but she declined   Meds ordered this encounter  Medications   olopatadine (PATANOL) 0.1 % ophthalmic solution    Sig: Place 1 drop into both eyes 2 (two) times daily as needed for allergies.    Dispense:  5 mL    Refill:  2    Follow-up: Return in about 1 year (around 05/26/2023) for [place], MD, FAAFP.  [facility] and [place], [place]   05/25/2022, 11:04 AM

## 2022-05-26 LAB — CMP14+EGFR
ALT: 11 IU/L (ref 0–32)
AST: 13 IU/L (ref 0–40)
Albumin/Globulin Ratio: 1.8 (ref 1.2–2.2)
Albumin: 4.8 g/dL (ref 3.9–4.9)
Alkaline Phosphatase: 87 IU/L (ref 44–121)
BUN/Creatinine Ratio: 19 (ref 9–23)
BUN: 10 mg/dL (ref 6–20)
Bilirubin Total: <0.2 mg/dL (ref 0.0–1.2)
CO2: 21 mmol/L (ref 20–29)
Calcium: 9.4 mg/dL (ref 8.7–10.2)
Chloride: 105 mmol/L (ref 96–106)
Creatinine, Ser: 0.52 mg/dL — ABNORMAL LOW (ref 0.57–1.00)
Globulin, Total: 2.6 g/dL (ref 1.5–4.5)
Glucose: 98 mg/dL (ref 70–99)
Potassium: 4.5 mmol/L (ref 3.5–5.2)
Sodium: 142 mmol/L (ref 134–144)
Total Protein: 7.4 g/dL (ref 6.0–8.5)
eGFR: 122 mL/min/{1.73_m2} (ref >59)

## 2022-05-26 LAB — CBC WITH DIFFERENTIAL/PLATELET
Basophils Absolute: 0 10*3/uL (ref 0.0–0.2)
Basos: 0 %
EOS (ABSOLUTE): 0.1 10*3/uL (ref 0.0–0.4)
Eos: 2 %
Hematocrit: 38.1 % (ref 34.0–46.6)
Hemoglobin: 12.6 g/dL (ref 11.1–15.9)
Immature Grans (Abs): 0 10*3/uL (ref 0.0–0.1)
Immature Granulocytes: 0 %
Lymphocytes Absolute: 2.1 10*3/uL (ref 0.7–3.1)
Lymphs: 31 %
MCH: 29.5 pg (ref 26.6–33.0)
MCHC: 33.1 g/dL (ref 31.5–35.7)
MCV: 89 fL (ref 79–97)
Monocytes Absolute: 0.4 10*3/uL (ref 0.1–0.9)
Monocytes: 5 %
Neutrophils Absolute: 4.1 10*3/uL (ref 1.4–7.0)
Neutrophils: 62 %
Platelets: 365 10*3/uL (ref 150–450)
RBC: 4.27 x10E6/uL (ref 3.77–5.28)
RDW: 13.1 % (ref 11.7–15.4)
WBC: 6.7 10*3/uL (ref 3.4–10.8)

## 2022-05-26 LAB — LP+NON-HDL CHOLESTEROL
Cholesterol, Total: 232 mg/dL — ABNORMAL HIGH (ref 100–199)
HDL: 52 mg/dL (ref >39)
LDL Chol Calc (NIH): 127 mg/dL — ABNORMAL HIGH (ref 0–99)
Total Non-HDL-Chol (LDL+VLDL): 180 mg/dL — ABNORMAL HIGH (ref 0–129)
Triglycerides: 302 mg/dL — ABNORMAL HIGH (ref 0–149)
VLDL Cholesterol Cal: 53 mg/dL — ABNORMAL HIGH (ref 5–40)

## 2022-05-27 LAB — CERVICOVAGINAL ANCILLARY ONLY
Bacterial Vaginitis (gardnerella): NEGATIVE
Candida Glabrata: NEGATIVE
Candida Vaginitis: NEGATIVE
Chlamydia: NEGATIVE
Comment: NEGATIVE
Comment: NEGATIVE
Comment: NEGATIVE
Comment: NEGATIVE
Comment: NEGATIVE
Comment: NORMAL
Neisseria Gonorrhea: NEGATIVE
Trichomonas: NEGATIVE

## 2023-01-21 ENCOUNTER — Other Ambulatory Visit: Payer: Self-pay

## 2023-01-21 MED ORDER — INFLUENZA VIRUS VACC SPLIT PF (FLUZONE) 0.5 ML IM SUSY
0.5000 mL | PREFILLED_SYRINGE | Freq: Once | INTRAMUSCULAR | 0 refills | Status: AC
  Filled 2023-01-21: qty 0.5, 1d supply, fill #0

## 2023-05-30 ENCOUNTER — Encounter: Payer: Self-pay | Admitting: Family Medicine

## 2023-05-30 ENCOUNTER — Other Ambulatory Visit: Payer: Self-pay

## 2023-05-30 ENCOUNTER — Ambulatory Visit: Payer: Self-pay | Attending: Family Medicine | Admitting: Family Medicine

## 2023-05-30 VITALS — BP 101/66 | HR 80 | Ht 59.0 in | Wt 172.6 lb

## 2023-05-30 DIAGNOSIS — Z0001 Encounter for general adult medical examination with abnormal findings: Secondary | ICD-10-CM

## 2023-05-30 DIAGNOSIS — Z131 Encounter for screening for diabetes mellitus: Secondary | ICD-10-CM

## 2023-05-30 DIAGNOSIS — Z13228 Encounter for screening for other metabolic disorders: Secondary | ICD-10-CM

## 2023-05-30 DIAGNOSIS — H1013 Acute atopic conjunctivitis, bilateral: Secondary | ICD-10-CM

## 2023-05-30 MED ORDER — OLOPATADINE HCL 0.1 % OP SOLN
1.0000 [drp] | Freq: Two times a day (BID) | OPHTHALMIC | 2 refills | Status: AC | PRN
  Filled 2023-05-30: qty 5, 50d supply, fill #0

## 2023-05-30 NOTE — Patient Instructions (Signed)
 VISIT SUMMARY:  Today, you came in for your annual physical exam. You reported no specific complaints or concerns. We discussed your diet, exercise habits, and the importance of regular dental and eye exams. You also mentioned needing a refill for your eye drops.  YOUR PLAN:  -ANNUAL PHYSICAL EXAM: An annual physical exam is a routine check-up to assess your overall health. We talked about the importance of regular exercise, eating enough fruits and vegetables, and keeping up with dental and eye exams. Blood tests were ordered to check your kidney function, liver function, cholesterol, and diabetes. Please try to increase your physical activity, even if it's just 10-15 minutes a day at home.  -EYE IRRITATION: Eye irritation can be caused by various factors such as dryness or allergies. You mentioned that you have run out of your eye drops, so a refill has been sent to your pharmacy.  -CERVICAL CANCER SCREENING: Cervical cancer screening is done through a Pap smear to detect any abnormal cells in the cervix. Your last Pap smear in February 2023 was normal, so your next one is due in 2028. No action is needed at this time.  -BREAST CANCER SCREENING: Breast cancer screening is typically done with a mammogram to detect any signs of breast cancer. Since you are not yet 38, you are not due for a mammogram at this time. No action is needed right now.  INSTRUCTIONS:  Please complete the blood tests as discussed. Once the results are available, they will be sent to you via MyChart. Try to incorporate short exercises into your daily routine and schedule regular dental and eye exams.

## 2023-05-30 NOTE — Progress Notes (Signed)
 Subjective:  Patient ID: [name], female    DOB: 1985/10/18  Age: 38 y.o. MRN: [id number]  CC: Annual Exam   HPI [name] is a 38 y.o. year old female here for an annual physical exam.  Interval History: Discussed the use of AI scribe software for clinical note transcription with the patient, who gave verbal consent to proceed.  She reports no specific complaints or concerns. She has not eaten today and is prepared for blood tests to check kidney function, liver function, cholesterol, and diabetes screen. She admits to sometimes eating an adequate amount of fruits and vegetables. She reports not having time for exercise due to work, but is open to incorporating short exercises at home. She does not regularly see a dentist or an eye doctor. She is not due for a mammogram until she turns [age] and her last Pap smear was in February 2023 with normal results, so she is not due for another until 2028. She also mentions needing a refill for eye drops for allergic conjunctivitis.        Past Medical History:  Diagnosis Date   Depression    hx - no meds    Past Surgical History:  Procedure Laterality Date   CESAREAN SECTION   2004, 2010, 2013   x 3   CHOLECYSTECTOMY N/A 09/03/2016   Procedure: LAPAROSCOPIC CHOLECYSTECTOMY;  Surgeon: Gaynelle Adu, MD;  Location: [facility] OR;  Service: General;  Laterality: N/A;    Family History  Problem Relation Age of Onset   Hypertension Father    Diabetes Sister     Social History   Socioeconomic History   Marital status: Single    Spouse name: Not on file   Number of children: Not on file   Years of education: Not on file   Highest education level: Not on file  Occupational History   Not on file  Tobacco Use   Smoking status: Former    Types: Cigars    Quit date: 06/04/2010    Years since quitting: 12.9   Smokeless tobacco: Never  Vaping Use   Vaping status: Never Used  Substance and Sexual Activity   Alcohol use: Yes     Alcohol/week: 1.0 standard drink of alcohol    Types: 1 Cans of beer per week    Comment: socially   Drug use: No   Sexual activity: Yes    Birth control/protection: None  Other Topics Concern   Not on file  Social History Narrative   Not on file   Social Drivers of Health   Financial Resource Strain: Not on file  Food Insecurity: Not on file  Transportation Needs: Not on file  Physical Activity: Not on file  Stress: Not on file  Social Connections: Not on file    Allergies  Allergen Reactions   Omeprazole Rash    Outpatient Medications Prior to Visit  Medication Sig Dispense Refill   olopatadine (PATANOL) 0.1 % ophthalmic solution Place 1 drop into both eyes 2 (two) times daily as needed for allergies. 5 mL 2   gabapentin (NEURONTIN) 300 MG capsule Take 1 capsule (300 mg total) by mouth at bedtime. (Patient not taking: Reported on 05/30/2023) 30 capsule 3   loratadine (CLARITIN) 10 MG tablet Take 1 tablet (10 mg total) by mouth daily. (Patient not taking: Reported on 05/30/2023) 10 tablet 0   No facility-administered medications prior to visit.     ROS Review of Systems  Constitutional:  Negative for activity change  and appetite change.  HENT:  Negative for sinus pressure and sore throat.   Respiratory:  Negative for chest tightness, shortness of breath and wheezing.   Cardiovascular:  Negative for chest pain and palpitations.  Gastrointestinal:  Negative for abdominal distention, abdominal pain and constipation.  Genitourinary: Negative.   Musculoskeletal: Negative.   Psychiatric/Behavioral:  Negative for behavioral problems and dysphoric mood.     Objective:  BP 101/66   Pulse 80   Ht 4\' 11"  (1.499 m)   Wt 172 lb 9.6 oz (78.3 kg)   SpO2 97%   BMI 34.86 kg/m      05/30/2023   10:05 AM 05/25/2022   10:24 AM 03/03/2022    2:05 PM  BP/Weight  Systolic BP 101 124 99  Diastolic BP 66 78 65  Wt. (Lbs) 172.6 184 186.2  BMI 34.86 kg/m2 37.16 kg/m2 37.61  kg/m2      Physical Exam Constitutional:      General: She is not in acute distress.    Appearance: She is well-developed. She is not diaphoretic.  HENT:     Head: Normocephalic.     Right Ear: External ear normal.     Left Ear: External ear normal.     Nose: Nose normal.     Mouth/Throat:     Mouth: Mucous membranes are moist.  Eyes:     Extraocular Movements: Extraocular movements intact.     Conjunctiva/sclera: Conjunctivae normal.     Pupils: Pupils are equal, round, and reactive to light.  Neck:     Vascular: No JVD.  Cardiovascular:     Rate and Rhythm: Normal rate and regular rhythm.     Pulses: Normal pulses.     Heart sounds: Normal heart sounds. No murmur heard.    No gallop.  Pulmonary:     Effort: Pulmonary effort is normal. No respiratory distress.     Breath sounds: Normal breath sounds. No wheezing or rales.  Chest:     Chest wall: No tenderness.  Abdominal:     General: Bowel sounds are normal. There is no distension.     Palpations: Abdomen is soft. There is no mass.     Tenderness: There is no abdominal tenderness.  Musculoskeletal:        General: No tenderness. Normal range of motion.     Cervical back: Normal range of motion.  Skin:    General: Skin is warm and dry.  Neurological:     Mental Status: She is alert and oriented to person, place, and time.     Deep Tendon Reflexes: Reflexes are normal and symmetric.  Psychiatric:        Mood and Affect: Mood normal.        Latest Ref Rng & Units 05/25/2022   10:57 AM 11/06/2021    4:27 PM 03/24/2021    1:49 PM  CMP  Glucose 70 - 99 mg/dL 98  295  92   BUN 6 - 20 mg/dL 10  10  12    Creatinine 0.57 - 1.00 mg/dL 6.21  3.08  6.57   Sodium 134 - 144 mmol/L 142  140  141   Potassium 3.5 - 5.2 mmol/L 4.5  4.1  4.3   Chloride 96 - 106 mmol/L 105  100  104   CO2 20 - 29 mmol/L 21  23  23    Calcium 8.7 - 10.2 mg/dL 9.4  9.5  9.5   Total Protein 6.0 - 8.5 g/dL 7.4  7.1  7.6   Total Bilirubin 0.0 -  1.2 mg/dL <1.6  0.2  0.3   Alkaline Phos 44 - 121 IU/L 87  68  84   AST 0 - 40 IU/L 13  20  18    ALT 0 - 32 IU/L 11  16  17      Lipid Panel     Component Value Date/Time   CHOL 232 (H) 05/25/2022 1057   TRIG 302 (H) 05/25/2022 1057   HDL 52 05/25/2022 1057   LDLCALC 127 (H) 05/25/2022 1057    CBC    Component Value Date/Time   WBC 6.7 05/25/2022 1057   WBC 9.5 09/05/2016 0356   RBC 4.27 05/25/2022 1057   RBC 3.25 (L) 09/05/2016 0356   HGB 12.6 05/25/2022 1057   HCT 38.1 05/25/2022 1057   PLT 365 05/25/2022 1057   MCV 89 05/25/2022 1057   MCH 29.5 05/25/2022 1057   MCH 28.6 09/05/2016 0356   MCHC 33.1 05/25/2022 1057   MCHC 32.3 09/05/2016 0356   RDW 13.1 05/25/2022 1057   LYMPHSABS 2.1 05/25/2022 1057   MONOABS 1.0 09/02/2016 1842   EOSABS 0.1 05/25/2022 1057   BASOSABS 0.0 05/25/2022 1057    Lab Results  Component Value Date   HGBA1C 5.9 (H) 11/06/2021    Assessment & Plan:      Annual Physical Exam   Patient presents for annual physical exam. Discussed the importance of regular exercise, adequate fruit and vegetable intake, and regular dental and eye exams.   -Order blood tests to check kidney function, liver function, cholesterol, and diabetes.   -Encourage patient to increase physical activity, even if it's just 10-15 minutes a day at home.   -Recommend regular dental and eye exams.   -Breast Cancer Screening   Patient is not yet 38, so mammogram is not yet due.   -No action needed at this time.    Allergic conjunctivitis Patient reports eye irritation. Previously used eye drops but has run out.   -Send refill for eye drops to the pharmacy.    Cervical Cancer Screening   Last Pap smear was in February 2023 and was normal. Next Pap smear due in 2028.   -No action needed at this time.     Follow-up   Patient has signed up for MyChart. Plan to send lab results via MyChart once she is available.   -Check lab results and send to patient via MyChart.           Meds ordered this encounter  Medications   olopatadine (PATANOL) 0.1 % ophthalmic solution    Sig: Place 1 drop into both eyes 2 (two) times daily as needed for allergies.    Dispense:  5 mL    Refill:  2    Follow-up: Return in about 1 year (around 05/29/2024) for CPE/ Preventive Health Exam.       Hoy Register, MD, FAAFP. [facility] [place], [place] [phone]   05/30/2023, 11:16 AM

## 2023-05-31 ENCOUNTER — Encounter: Payer: Self-pay | Admitting: Family Medicine

## 2023-05-31 LAB — CMP14+EGFR
ALT: 13 IU/L (ref 0–32)
AST: 17 IU/L (ref 0–40)
Albumin: 4.6 g/dL (ref 3.9–4.9)
Alkaline Phosphatase: 83 IU/L (ref 44–121)
BUN/Creatinine Ratio: 13 (ref 9–23)
BUN: 8 mg/dL (ref 6–20)
Bilirubin Total: 0.6 mg/dL (ref 0.0–1.2)
CO2: 24 mmol/L (ref 20–29)
Calcium: 9.5 mg/dL (ref 8.7–10.2)
Chloride: 102 mmol/L (ref 96–106)
Creatinine, Ser: 0.6 mg/dL (ref 0.57–1.00)
Globulin, Total: 3 g/dL (ref 1.5–4.5)
Glucose: 99 mg/dL (ref 70–99)
Potassium: 4.7 mmol/L (ref 3.5–5.2)
Sodium: 142 mmol/L (ref 134–144)
Total Protein: 7.6 g/dL (ref 6.0–8.5)
eGFR: 117 mL/min/{1.73_m2} (ref >59)

## 2023-05-31 LAB — CBC WITH DIFFERENTIAL/PLATELET
Basophils Absolute: 0 10*3/uL (ref 0.0–0.2)
Basos: 0 %
EOS (ABSOLUTE): 0.1 10*3/uL (ref 0.0–0.4)
Eos: 2 %
Hematocrit: 38.9 % (ref 34.0–46.6)
Hemoglobin: 12.9 g/dL (ref 11.1–15.9)
Immature Grans (Abs): 0 10*3/uL (ref 0.0–0.1)
Immature Granulocytes: 0 %
Lymphocytes Absolute: 2 10*3/uL (ref 0.7–3.1)
Lymphs: 26 %
MCH: 29.3 pg (ref 26.6–33.0)
MCHC: 33.2 g/dL (ref 31.5–35.7)
MCV: 88 fL (ref 79–97)
Monocytes Absolute: 0.5 10*3/uL (ref 0.1–0.9)
Monocytes: 7 %
Neutrophils Absolute: 5.1 10*3/uL (ref 1.4–7.0)
Neutrophils: 65 %
Platelets: 307 10*3/uL (ref 150–450)
RBC: 4.4 x10E6/uL (ref 3.77–5.28)
RDW: 13.6 % (ref 11.7–15.4)
WBC: 7.8 10*3/uL (ref 3.4–10.8)

## 2023-05-31 LAB — HEMOGLOBIN A1C
Est. average glucose Bld gHb Est-mCnc: 114 mg/dL
Hgb A1c MFr Bld: 5.6 % (ref 4.8–5.6)

## 2023-05-31 LAB — LP+NON-HDL CHOLESTEROL
Cholesterol, Total: 226 mg/dL — ABNORMAL HIGH (ref 100–199)
HDL: 80 mg/dL
LDL Chol Calc (NIH): 132 mg/dL — ABNORMAL HIGH (ref 0–99)
Total Non-HDL-Chol (LDL+VLDL): 146 mg/dL — ABNORMAL HIGH (ref 0–129)
Triglycerides: 83 mg/dL (ref 0–149)
VLDL Cholesterol Cal: 14 mg/dL (ref 5–40)

## 2024-03-07 ENCOUNTER — Other Ambulatory Visit: Payer: Self-pay

## 2024-03-07 MED ORDER — FLUZONE 0.5 ML IM SUSY
0.5000 mL | PREFILLED_SYRINGE | Freq: Once | INTRAMUSCULAR | 0 refills | Status: AC
  Filled 2024-03-07: qty 0.5, 1d supply, fill #0

## 2024-05-28 ENCOUNTER — Encounter: Payer: Self-pay | Admitting: Family Medicine
